# Patient Record
Sex: Female | Born: 1955 | Race: White | Hispanic: No | Marital: Married | Smoking: Never smoker
Health system: Southern US, Community
[De-identification: ages and names within clinical notes are randomized; demographics above are authoritative.]

## PROBLEM LIST (undated history)

## (undated) DIAGNOSIS — I10 Essential (primary) hypertension: Secondary | ICD-10-CM

## (undated) DIAGNOSIS — E785 Hyperlipidemia, unspecified: Secondary | ICD-10-CM

## (undated) DIAGNOSIS — C801 Malignant (primary) neoplasm, unspecified: Secondary | ICD-10-CM

## (undated) HISTORY — DX: Essential (primary) hypertension: I10

## (undated) HISTORY — PX: MOUTH SURGERY: SHX715

## (undated) HISTORY — PX: EYE SURGERY: SHX253

## (undated) HISTORY — PX: CLOSED REDUCTION ANKLE DISLOCATION: SUR209

## (undated) HISTORY — DX: Hyperlipidemia, unspecified: E78.5

---

## 2003-12-19 ENCOUNTER — Encounter: Payer: Self-pay | Admitting: Internal Medicine

## 2004-08-11 ENCOUNTER — Ambulatory Visit: Payer: Self-pay | Admitting: Internal Medicine

## 2005-05-01 ENCOUNTER — Emergency Department (HOSPITAL_COMMUNITY): Admission: EM | Admit: 2005-05-01 | Discharge: 2005-05-01 | Payer: Self-pay | Admitting: Emergency Medicine

## 2005-05-01 ENCOUNTER — Ambulatory Visit: Payer: Self-pay | Admitting: Family Medicine

## 2005-05-10 ENCOUNTER — Ambulatory Visit: Payer: Self-pay | Admitting: Internal Medicine

## 2005-06-21 ENCOUNTER — Ambulatory Visit: Payer: Self-pay | Admitting: Internal Medicine

## 2005-09-20 ENCOUNTER — Ambulatory Visit: Payer: Self-pay | Admitting: Internal Medicine

## 2005-12-20 ENCOUNTER — Ambulatory Visit: Payer: Self-pay | Admitting: Internal Medicine

## 2006-01-20 ENCOUNTER — Ambulatory Visit: Payer: Self-pay | Admitting: Internal Medicine

## 2006-02-14 ENCOUNTER — Ambulatory Visit: Payer: Self-pay | Admitting: Unknown Physician Specialty

## 2006-02-20 ENCOUNTER — Ambulatory Visit: Payer: Self-pay | Admitting: Unknown Physician Specialty

## 2006-04-21 ENCOUNTER — Ambulatory Visit: Payer: Self-pay | Admitting: Internal Medicine

## 2006-05-03 ENCOUNTER — Ambulatory Visit: Payer: Self-pay

## 2006-05-03 ENCOUNTER — Encounter: Payer: Self-pay | Admitting: Cardiology

## 2006-06-02 ENCOUNTER — Ambulatory Visit: Payer: Self-pay | Admitting: Internal Medicine

## 2007-03-14 ENCOUNTER — Encounter: Payer: Self-pay | Admitting: Internal Medicine

## 2007-03-14 DIAGNOSIS — I10 Essential (primary) hypertension: Secondary | ICD-10-CM

## 2007-03-14 DIAGNOSIS — E782 Mixed hyperlipidemia: Secondary | ICD-10-CM | POA: Insufficient documentation

## 2007-03-14 DIAGNOSIS — E1129 Type 2 diabetes mellitus with other diabetic kidney complication: Secondary | ICD-10-CM

## 2007-03-14 DIAGNOSIS — E1165 Type 2 diabetes mellitus with hyperglycemia: Secondary | ICD-10-CM

## 2007-03-23 ENCOUNTER — Encounter: Payer: Self-pay | Admitting: Internal Medicine

## 2007-05-10 ENCOUNTER — Other Ambulatory Visit: Admission: RE | Admit: 2007-05-10 | Discharge: 2007-05-10 | Payer: Self-pay | Admitting: Internal Medicine

## 2007-05-10 ENCOUNTER — Encounter: Payer: Self-pay | Admitting: Internal Medicine

## 2007-05-10 ENCOUNTER — Ambulatory Visit: Payer: Self-pay | Admitting: Internal Medicine

## 2007-05-10 DIAGNOSIS — N209 Urinary calculus, unspecified: Secondary | ICD-10-CM | POA: Insufficient documentation

## 2007-05-11 LAB — CONVERTED CEMR LAB
AST: 16 units/L (ref 0–37)
Albumin: 3.6 g/dL (ref 3.5–5.2)
BUN: 11 mg/dL (ref 6–23)
Chloride: 107 meq/L (ref 96–112)
Creatinine,U: 112.2 mg/dL
GFR calc non Af Amer: 95 mL/min
Hgb A1c MFr Bld: 7.8 % — ABNORMAL HIGH (ref 4.6–6.0)
Potassium: 4 meq/L (ref 3.5–5.1)
TSH: 2.97 microintl units/mL (ref 0.35–5.50)

## 2007-05-15 ENCOUNTER — Encounter (INDEPENDENT_AMBULATORY_CARE_PROVIDER_SITE_OTHER): Payer: Self-pay | Admitting: *Deleted

## 2007-06-06 ENCOUNTER — Ambulatory Visit: Payer: Self-pay | Admitting: Internal Medicine

## 2007-06-06 ENCOUNTER — Encounter: Payer: Self-pay | Admitting: Internal Medicine

## 2007-06-08 ENCOUNTER — Encounter (INDEPENDENT_AMBULATORY_CARE_PROVIDER_SITE_OTHER): Payer: Self-pay | Admitting: *Deleted

## 2007-07-25 ENCOUNTER — Encounter: Payer: Self-pay | Admitting: Internal Medicine

## 2007-08-16 ENCOUNTER — Encounter: Payer: Self-pay | Admitting: Internal Medicine

## 2007-09-12 ENCOUNTER — Encounter: Payer: Self-pay | Admitting: Internal Medicine

## 2007-10-26 ENCOUNTER — Encounter: Payer: Self-pay | Admitting: Internal Medicine

## 2007-11-28 ENCOUNTER — Encounter: Payer: Self-pay | Admitting: Internal Medicine

## 2008-02-01 ENCOUNTER — Encounter: Payer: Self-pay | Admitting: Internal Medicine

## 2008-02-13 ENCOUNTER — Encounter: Payer: Self-pay | Admitting: Internal Medicine

## 2008-02-27 ENCOUNTER — Encounter: Payer: Self-pay | Admitting: Internal Medicine

## 2009-03-13 ENCOUNTER — Ambulatory Visit: Payer: Self-pay | Admitting: Family

## 2009-03-27 ENCOUNTER — Ambulatory Visit: Payer: Self-pay | Admitting: Gastroenterology

## 2009-03-27 ENCOUNTER — Ambulatory Visit: Payer: Self-pay | Admitting: Family

## 2009-04-09 LAB — HM COLONOSCOPY

## 2009-05-23 LAB — HM COLONOSCOPY: HM Colonoscopy: ABNORMAL

## 2009-06-03 LAB — HM COLONOSCOPY

## 2009-09-23 ENCOUNTER — Ambulatory Visit: Payer: Self-pay | Admitting: Family

## 2009-12-04 ENCOUNTER — Inpatient Hospital Stay: Payer: Self-pay | Admitting: Unknown Physician Specialty

## 2010-03-23 LAB — HM MAMMOGRAPHY: HM Mammogram: NORMAL

## 2010-04-01 ENCOUNTER — Ambulatory Visit: Payer: Self-pay | Admitting: Internal Medicine

## 2010-07-29 ENCOUNTER — Ambulatory Visit: Payer: Self-pay | Admitting: Ophthalmology

## 2010-08-03 IMAGING — CR DG ANKLE 2V *L*
1 series · 2 of 2 positions shown · non-contrast
Comparison: none

REASON FOR EXAM: post reduction
COMMENTS:

PROCEDURE:     DXR - DXR ANKLE LEFT AP AND LATERAL  - December 04, 2009  [DATE]
RESULT:     Images of the left ankle labeled postreduction show a more
anatomic alignment with casting material and a splint present.

[Series 1: view not recorded · 0.17mm/px · 2 of 2 slices shown]
[im 1/2]
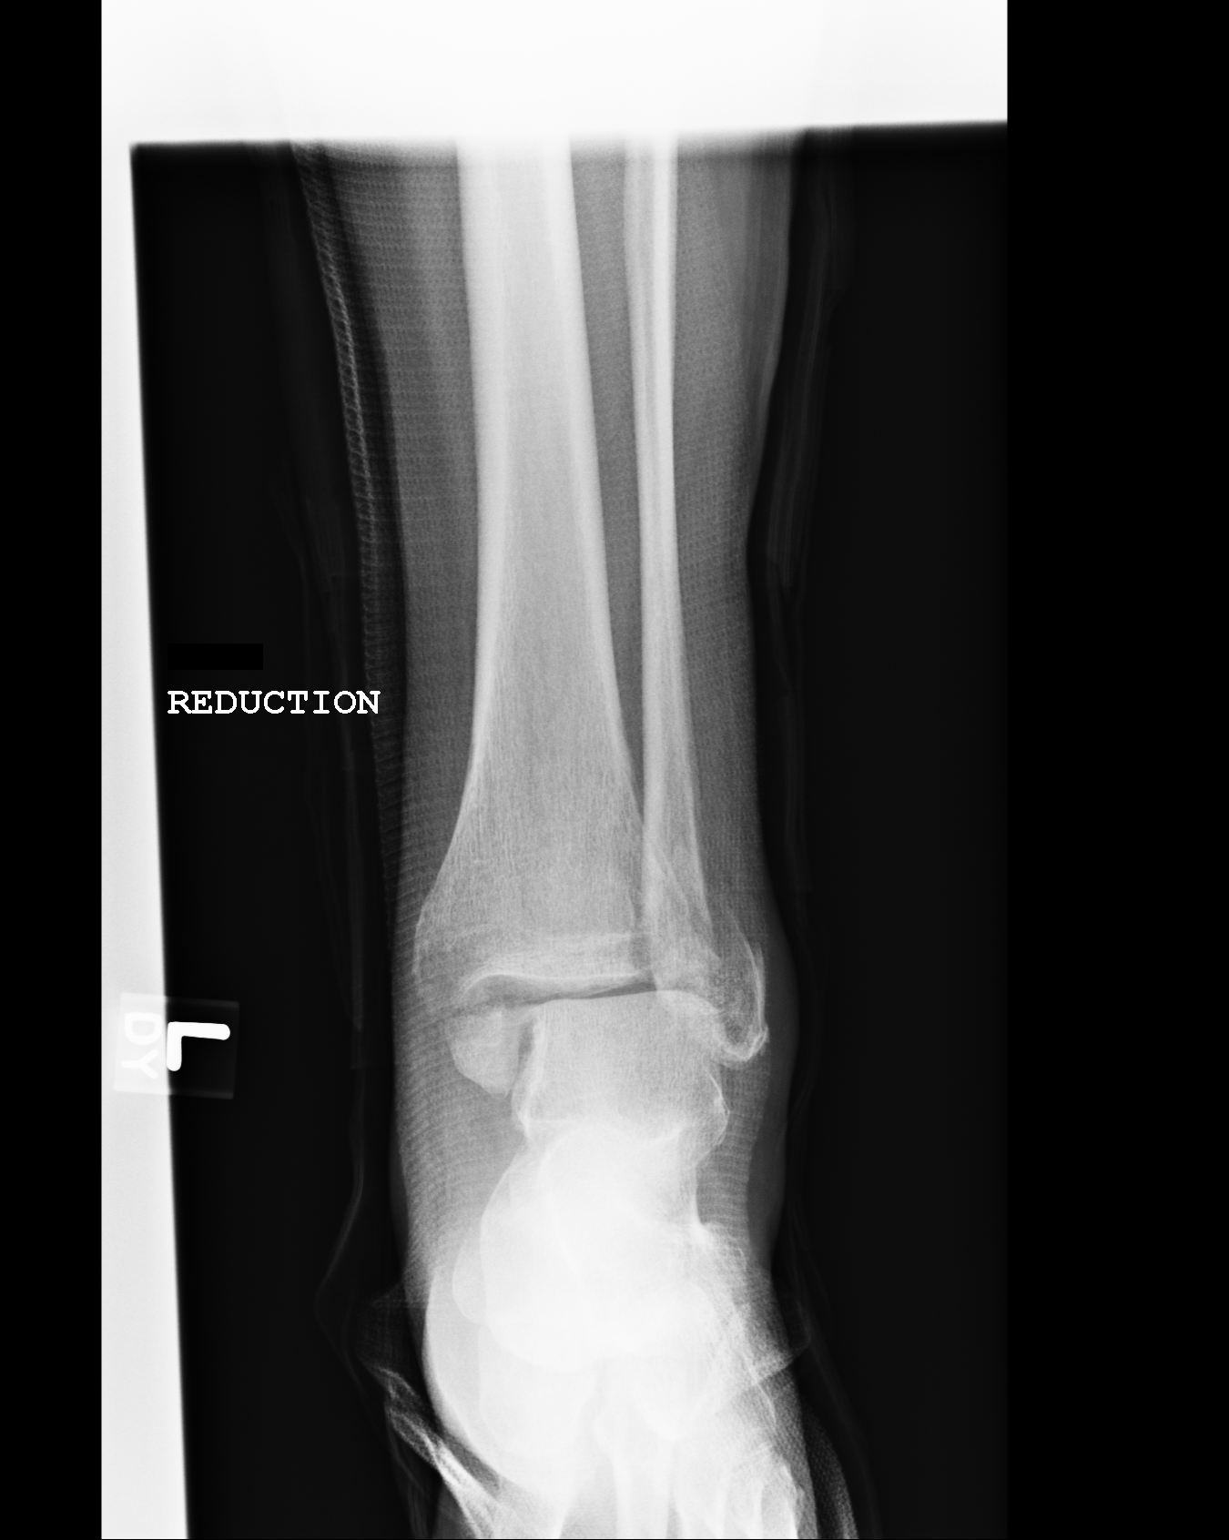
[im 2/2]
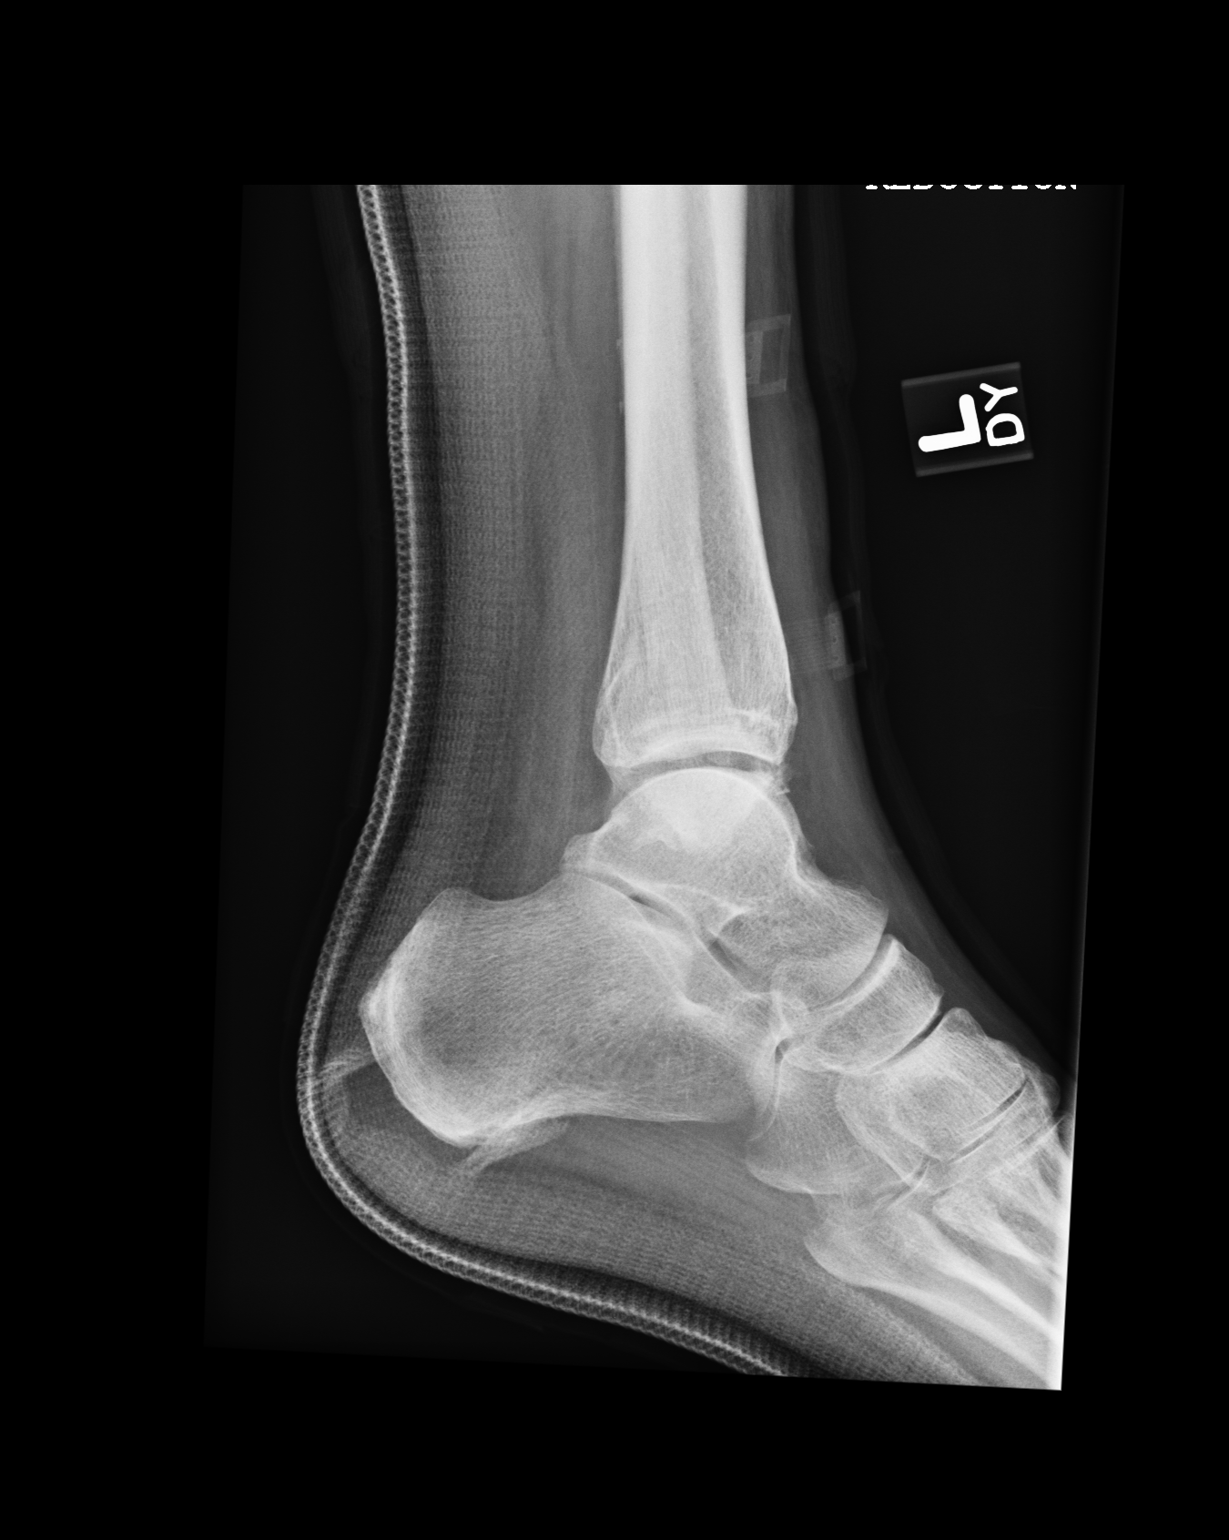

[2 of 2 positions shown; findings below may reference images not displayed]

IMPRESSION: Please see above.

## 2010-08-03 IMAGING — CR DG ANKLE COMPLETE 3+V*L*
1 series · 4 of 4 positions shown · non-contrast
Comparison: none

REASON FOR EXAM: fall; pain
COMMENTS:

[Series 1: view not recorded · 0.17mm/px · 4 of 4 slices shown]
[im 1/4]
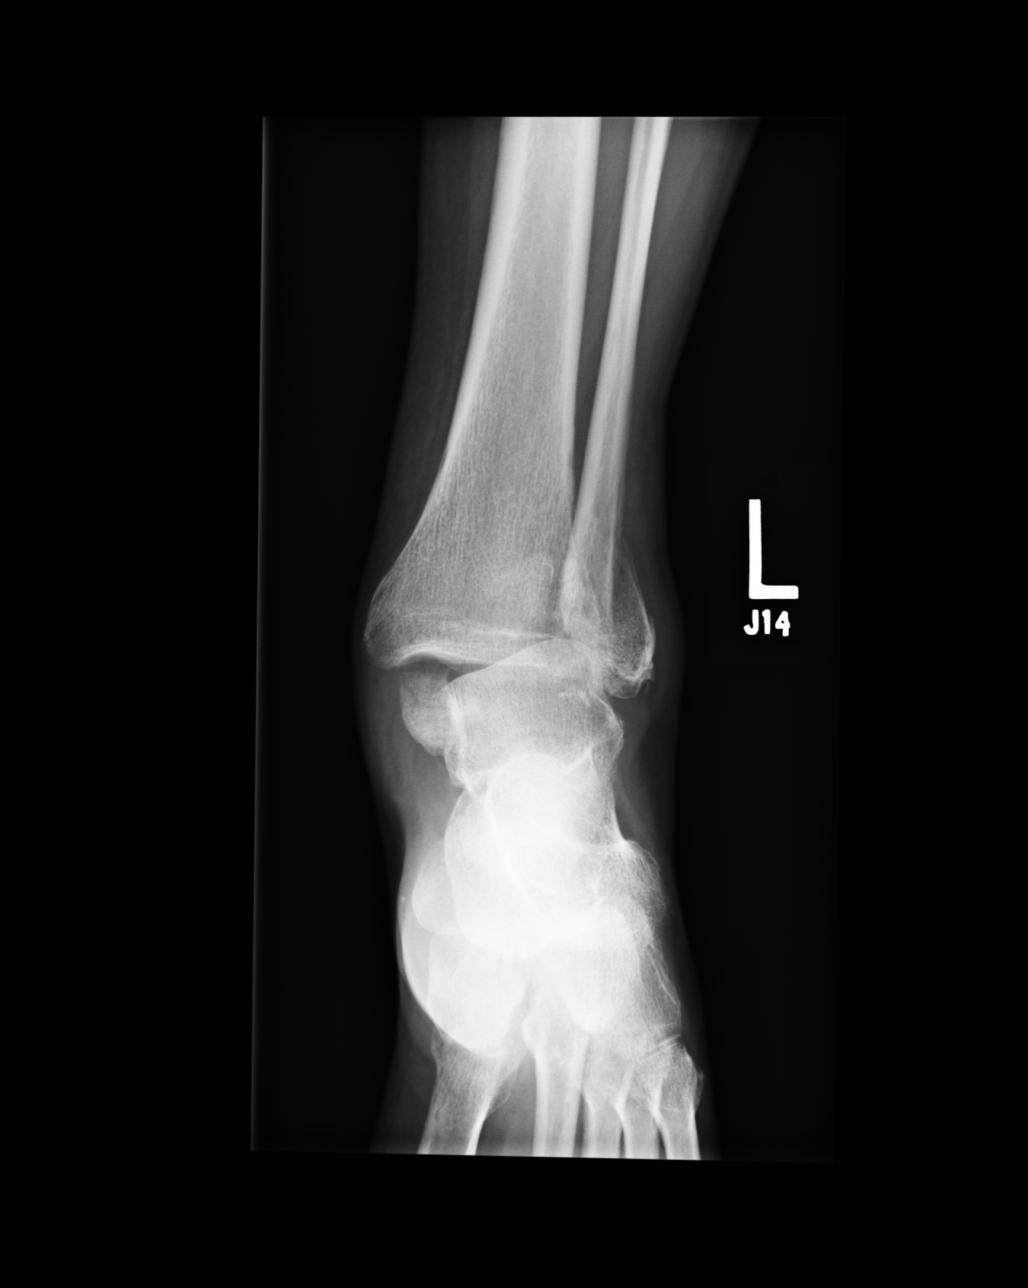
[im 2/4]
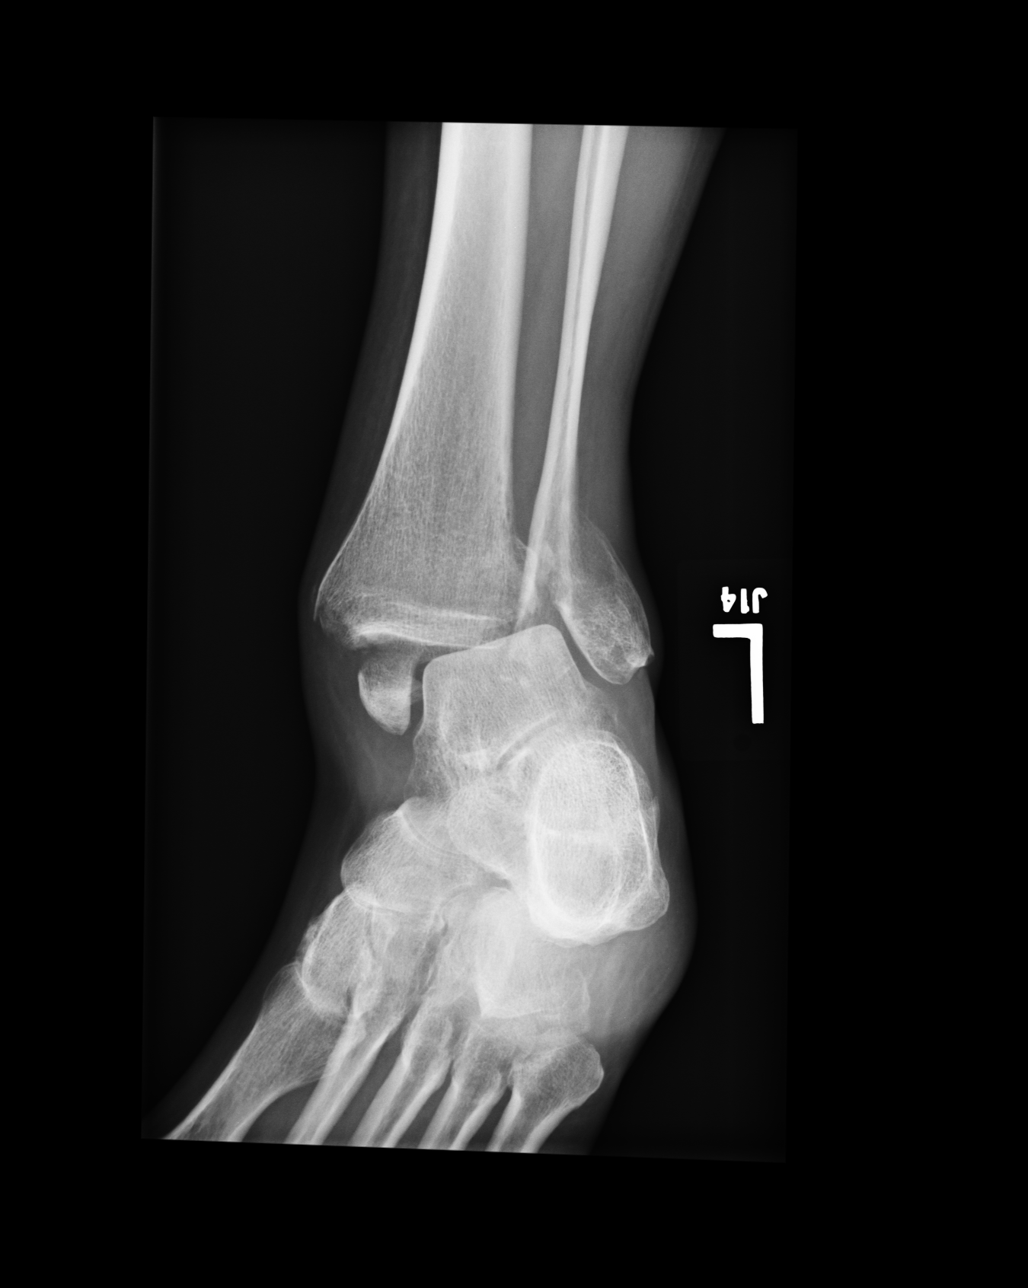
[im 3/4]
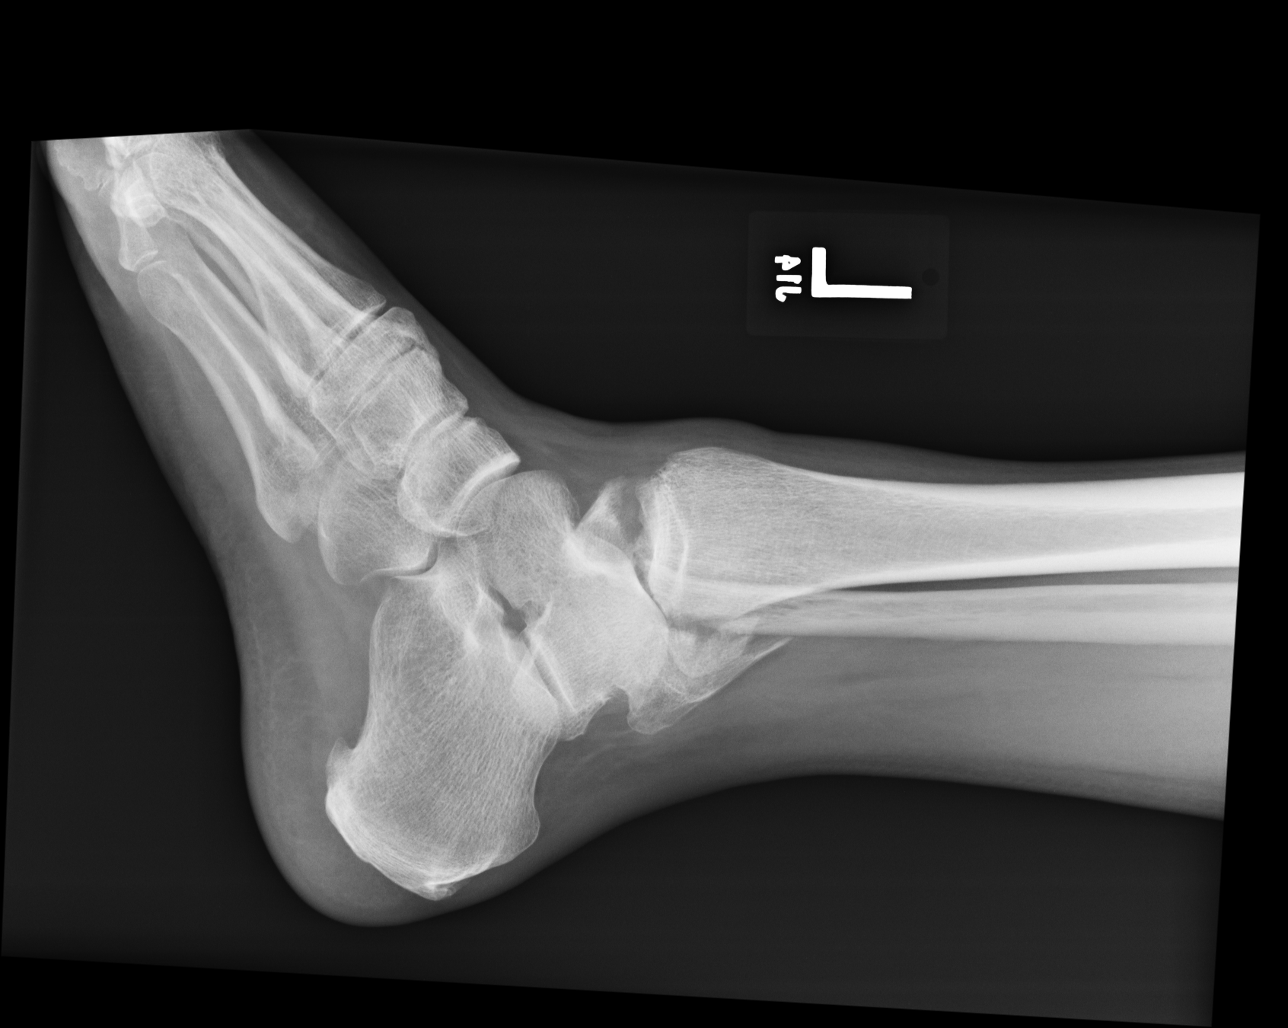
[im 4/4]
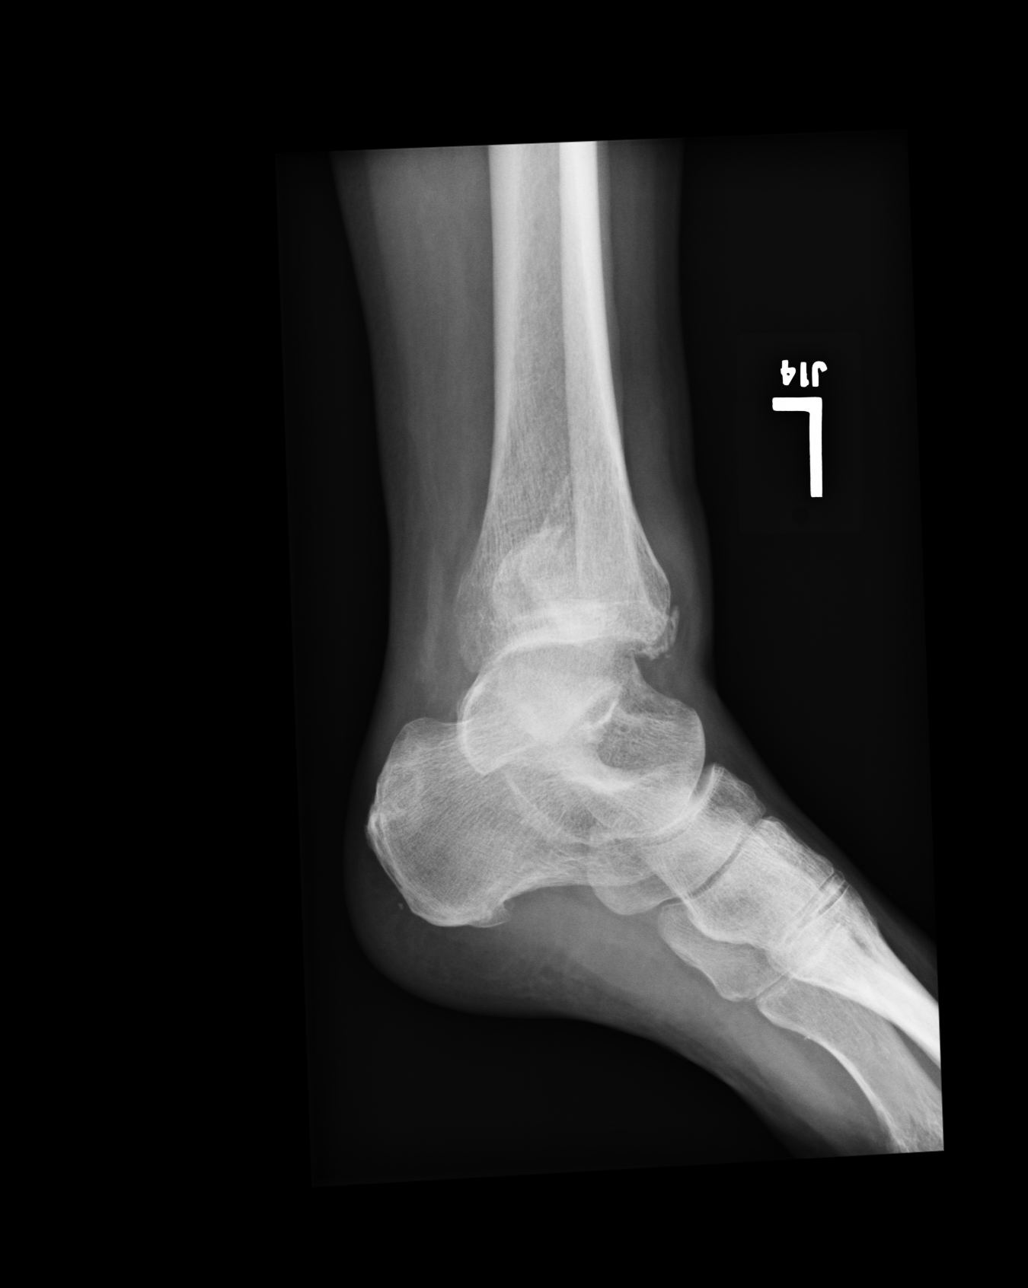

[4 of 4 positions shown; findings below may reference images not displayed]

PROCEDURE:     DXR - DXR ANKLE LEFT COMPLETE  - December 04, 2009 [DATE]

RESULT:     Trimalleolar fracture/dislocation is present with lateral
displacement and angulation and posterior displacement of the talus. There
is deformity of the talar dome suggested on the lateral view. The calcaneus
appears intact with some degenerative spurring.
IMPRESSION: Trimalleolar fracture/dislocation as described.

## 2010-08-03 IMAGING — CR DG CHEST 1V PORT
1 series · 1 of 1 positions shown · non-contrast
Comparison: none

REASON FOR EXAM: ankle fracture preop
COMMENTS:

PROCEDURE:     DXR - DXR PORTABLE CHEST SINGLE VIEW  - December 04, 2009 [DATE]
RESULT:     There is shallow inspiration. The lungs appear clear. The
cardiac silhouette and pulmonary vasculature are normal. The bony and
mediastinal structures are unremarkable.

[view not recorded]
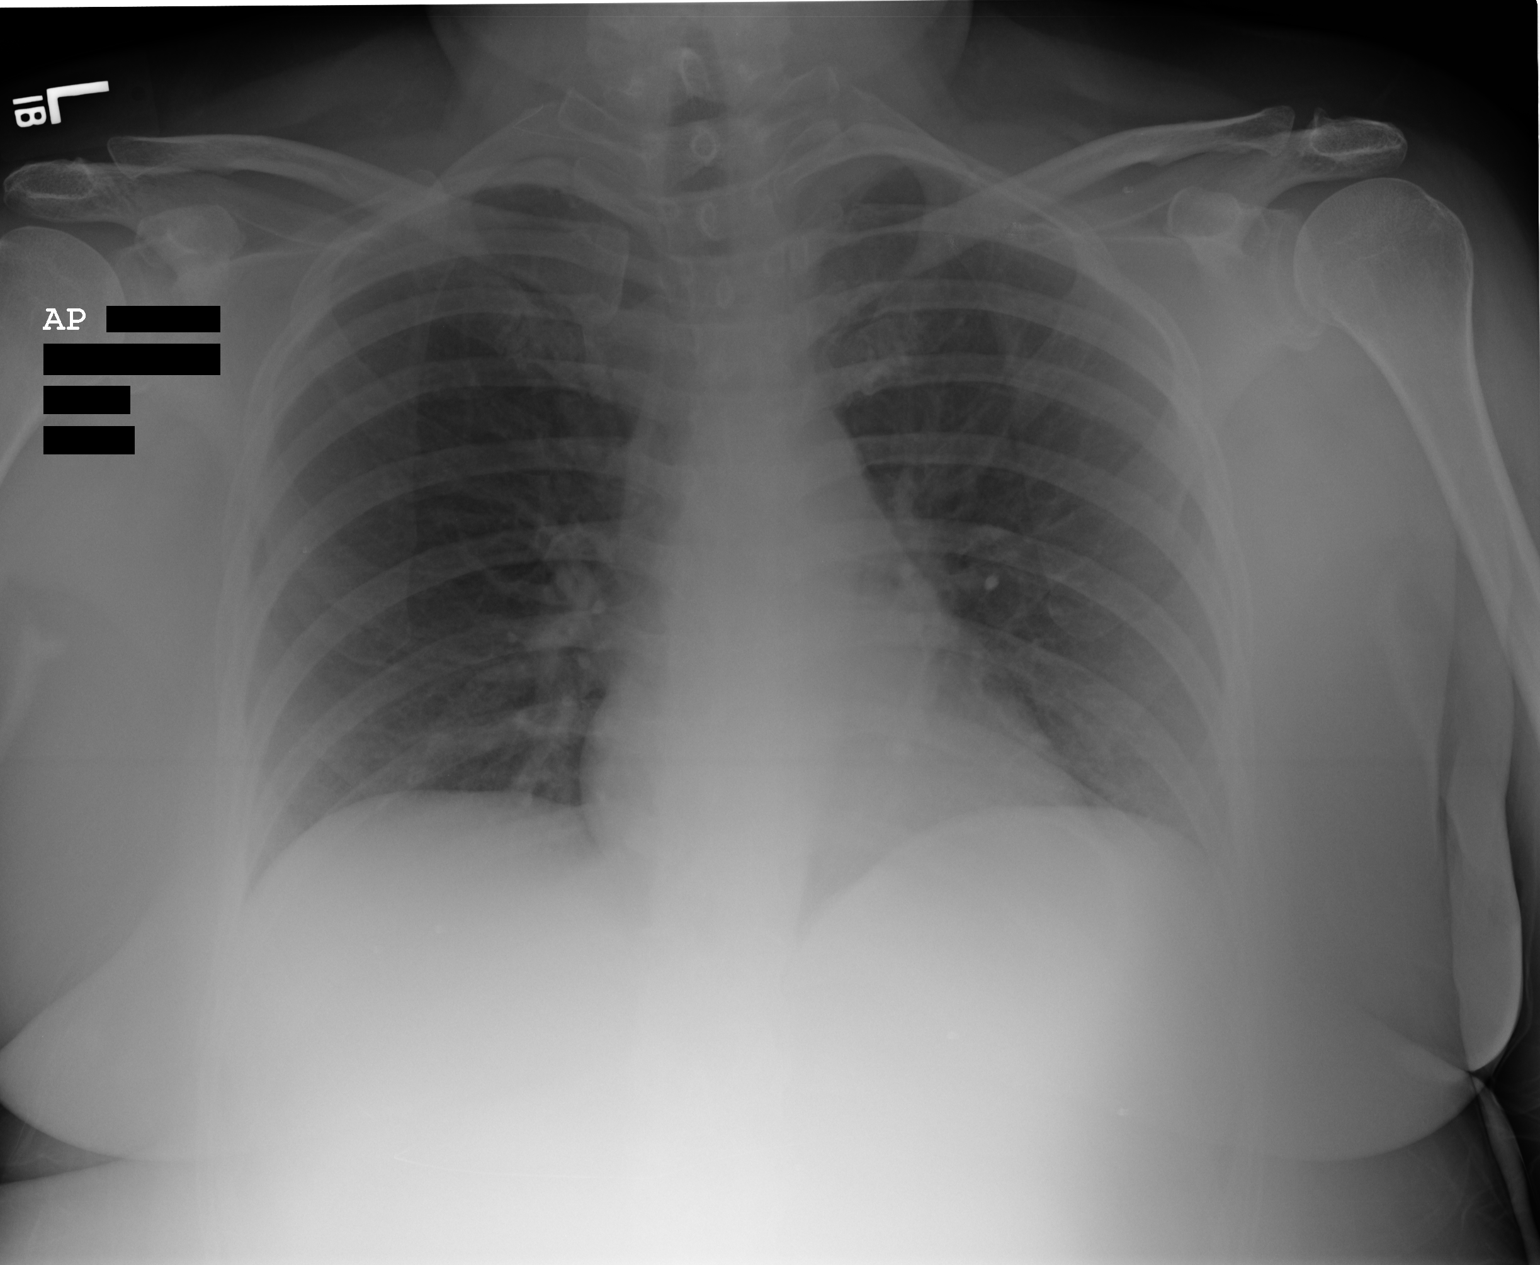

[1 of 1 positions shown; findings below may reference images not displayed]

IMPRESSION: No acute cardiopulmonary disease demonstrated.

## 2010-08-04 ENCOUNTER — Ambulatory Visit: Payer: Self-pay | Admitting: Ophthalmology

## 2010-10-14 ENCOUNTER — Ambulatory Visit: Payer: Self-pay | Admitting: Ophthalmology

## 2011-06-17 ENCOUNTER — Encounter: Payer: Self-pay | Admitting: Internal Medicine

## 2011-06-18 ENCOUNTER — Encounter: Payer: Self-pay | Admitting: Internal Medicine

## 2011-06-18 ENCOUNTER — Ambulatory Visit (INDEPENDENT_AMBULATORY_CARE_PROVIDER_SITE_OTHER): Payer: BC Managed Care – PPO | Admitting: Internal Medicine

## 2011-06-18 DIAGNOSIS — Z1239 Encounter for other screening for malignant neoplasm of breast: Secondary | ICD-10-CM

## 2011-06-18 DIAGNOSIS — Z23 Encounter for immunization: Secondary | ICD-10-CM

## 2011-06-18 DIAGNOSIS — I10 Essential (primary) hypertension: Secondary | ICD-10-CM

## 2011-06-18 DIAGNOSIS — Z79899 Other long term (current) drug therapy: Secondary | ICD-10-CM

## 2011-06-18 DIAGNOSIS — E785 Hyperlipidemia, unspecified: Secondary | ICD-10-CM

## 2011-06-18 DIAGNOSIS — E782 Mixed hyperlipidemia: Secondary | ICD-10-CM

## 2011-06-18 DIAGNOSIS — E039 Hypothyroidism, unspecified: Secondary | ICD-10-CM

## 2011-06-18 DIAGNOSIS — E119 Type 2 diabetes mellitus without complications: Secondary | ICD-10-CM

## 2011-06-18 MED ORDER — LOSARTAN POTASSIUM 100 MG PO TABS: 100.0000 mg | ORAL_TABLET | Freq: Every day | ORAL | Status: DC

## 2011-06-18 MED ORDER — AMLODIPINE BESYLATE 10 MG PO TABS: 10.0000 mg | ORAL_TABLET | Freq: Every day | ORAL | Status: DC

## 2011-06-18 MED ORDER — ATENOLOL 50 MG PO TABS: 50.0000 mg | ORAL_TABLET | Freq: Every day | ORAL | Status: DC

## 2011-06-18 NOTE — Progress Notes (Signed)
  Subjective:    Patient ID: Pamela Harris, female    DOB: 04/30/1956, 55 y.o.   MRN: [id number]  HPI  55 yo perimenopausal female with a history of diabetes mellitus, hyperlipidemia, hypertension and obesity who is here for her annual exam which will be postponed since she is currently menstruating. She has not been exercising regularly but is watching her diet and restricting her carbohydrates.     Review of Systems     Objective:   Physical Exam        Assessment & Plan:

## 2011-06-18 NOTE — Patient Instructions (Signed)
If you check your pre dinner sugar and it is < 150 and your dinner is a nonstarchy dinner (no potatoes, mexican, pasta, or rice based)  Decrease the evening insulin dose to to 90 units   If the meal is starchy and the pre dinner blood sugar is > 150,   Take 110 units. For everything in between continue 100 units.   Check a 4:40 or 5 am blood sugar to  see if you are bottoming out (<80).  I would like you to walk for a minimim of 20 minutes  daily to try to imrorve your weifght and your triglycerides,

## 2011-06-20 ENCOUNTER — Encounter: Payer: Self-pay | Admitting: Internal Medicine

## 2011-06-20 DIAGNOSIS — E039 Hypothyroidism, unspecified: Secondary | ICD-10-CM | POA: Insufficient documentation

## 2011-06-20 NOTE — Assessment & Plan Note (Addendum)
Currently taking red yeast rice, with repeat lipids due,  Goal LDL is 70

## 2011-06-20 NOTE — Assessment & Plan Note (Signed)
Controlled on current therapy ?

## 2011-06-20 NOTE — Assessment & Plan Note (Signed)
Repeat TSH is due on current Synthroid dose.

## 2011-06-21 ENCOUNTER — Telehealth: Payer: Self-pay | Admitting: Internal Medicine

## 2011-06-21 ENCOUNTER — Other Ambulatory Visit (INDEPENDENT_AMBULATORY_CARE_PROVIDER_SITE_OTHER): Payer: BC Managed Care – PPO | Admitting: *Deleted

## 2011-06-21 DIAGNOSIS — Z124 Encounter for screening for malignant neoplasm of cervix: Secondary | ICD-10-CM | POA: Insufficient documentation

## 2011-06-21 DIAGNOSIS — I10 Essential (primary) hypertension: Secondary | ICD-10-CM

## 2011-06-21 DIAGNOSIS — E119 Type 2 diabetes mellitus without complications: Secondary | ICD-10-CM

## 2011-06-21 DIAGNOSIS — E785 Hyperlipidemia, unspecified: Secondary | ICD-10-CM

## 2011-06-21 LAB — COMPREHENSIVE METABOLIC PANEL
AST: 16 U/L (ref 0–37)
CO2: 24 mEq/L (ref 19–32)
Chloride: 107 mEq/L (ref 96–112)
Creatinine, Ser: 0.9 mg/dL (ref 0.4–1.2)
Glucose, Bld: 167 mg/dL — ABNORMAL HIGH (ref 70–99)
Sodium: 139 mEq/L (ref 135–145)

## 2011-06-21 LAB — LIPID PANEL
Cholesterol: 218 mg/dL — ABNORMAL HIGH (ref 0–200)
Total CHOL/HDL Ratio: 5
Triglycerides: 364 mg/dL — ABNORMAL HIGH (ref 0.0–149.0)
VLDL: 72.8 mg/dL — ABNORMAL HIGH (ref 0.0–40.0)

## 2011-06-21 LAB — MICROALBUMIN / CREATININE URINE RATIO: Microalb Creat Ratio: 1.4 mg/g (ref 0.0–30.0)

## 2011-06-21 LAB — HEMOGLOBIN A1C: Hgb A1c MFr Bld: 7.5 % — ABNORMAL HIGH (ref 4.6–6.5)

## 2011-06-21 NOTE — Telephone Encounter (Signed)
You had asked me to find out when patients last PAP was, I called [facility] and they stated her last PAP was 06/17/10.

## 2011-06-22 ENCOUNTER — Telehealth: Payer: Self-pay | Admitting: Internal Medicine

## 2011-06-22 NOTE — Telephone Encounter (Signed)
Diabetes is better controlled.  hgba1c is 7.5   Triglycerides are still > 300  And LDL is 119 so I recommend a trial of tricor  145 mcg one tablet daily  330 3 refills.  reperat fastignl lipids,  CMET and hgba1c in 3 months

## 2011-06-23 NOTE — Telephone Encounter (Signed)
I have tried to call patient with no success due to not having a voicemail set up.

## 2011-06-25 ENCOUNTER — Ambulatory Visit: Payer: Self-pay | Admitting: Internal Medicine

## 2011-06-28 NOTE — Telephone Encounter (Signed)
Tried calling patient, but got no answer and there is no way to leave a message. Will try again later.

## 2011-06-30 MED ORDER — FENOFIBRATE 145 MG PO TABS: 145.0000 mg | ORAL_TABLET | Freq: Every day | ORAL | Status: DC

## 2011-06-30 NOTE — Telephone Encounter (Signed)
Informed pt. Of results and called in Rx.

## 2011-07-13 ENCOUNTER — Encounter: Payer: Self-pay | Admitting: Internal Medicine

## 2011-08-19 ENCOUNTER — Encounter: Payer: Self-pay | Admitting: Internal Medicine

## 2011-09-20 ENCOUNTER — Encounter: Payer: BC Managed Care – PPO | Admitting: Internal Medicine

## 2011-09-24 ENCOUNTER — Other Ambulatory Visit (HOSPITAL_COMMUNITY)
Admission: RE | Admit: 2011-09-24 | Discharge: 2011-09-24 | Disposition: A | Discharge status: Home / Self Care | Payer: BC Managed Care – PPO | Source: Ambulatory Visit | Attending: Internal Medicine | Admitting: Internal Medicine

## 2011-09-24 ENCOUNTER — Ambulatory Visit (INDEPENDENT_AMBULATORY_CARE_PROVIDER_SITE_OTHER): Payer: BC Managed Care – PPO | Admitting: Internal Medicine

## 2011-09-24 ENCOUNTER — Encounter: Payer: Self-pay | Admitting: Internal Medicine

## 2011-09-24 DIAGNOSIS — N760 Acute vaginitis: Secondary | ICD-10-CM | POA: Insufficient documentation

## 2011-09-24 DIAGNOSIS — Z0000 Encounter for general adult medical examination without abnormal findings: Secondary | ICD-10-CM

## 2011-09-24 DIAGNOSIS — B3731 Acute candidiasis of vulva and vagina: Secondary | ICD-10-CM

## 2011-09-24 DIAGNOSIS — E785 Hyperlipidemia, unspecified: Secondary | ICD-10-CM

## 2011-09-24 DIAGNOSIS — I10 Essential (primary) hypertension: Secondary | ICD-10-CM

## 2011-09-24 DIAGNOSIS — Z124 Encounter for screening for malignant neoplasm of cervix: Secondary | ICD-10-CM

## 2011-09-24 DIAGNOSIS — B373 Candidiasis of vulva and vagina: Secondary | ICD-10-CM

## 2011-09-24 DIAGNOSIS — E119 Type 2 diabetes mellitus without complications: Secondary | ICD-10-CM

## 2011-09-24 DIAGNOSIS — Z01419 Encounter for gynecological examination (general) (routine) without abnormal findings: Secondary | ICD-10-CM | POA: Insufficient documentation

## 2011-09-24 MED ORDER — FLUCONAZOLE 150 MG PO TABS: 150.0000 mg | ORAL_TABLET | Freq: Every day | ORAL | Status: AC

## 2011-09-24 MED ORDER — NYSTATIN 100000 UNIT/GM EX CREA: TOPICAL_CREAM | CUTANEOUS | Status: AC | PRN

## 2011-09-24 NOTE — Assessment & Plan Note (Addendum)
Her a1c was 7.5 on 100 units of 7030 insulin twice daily. Her LDL was 113 and trigs 364 in October.    She has normal renal function, mild micro-albuminemia and has been taking a fenifibrate and red yeast rice since last visit.  Repeat labs are due now.

## 2011-09-24 NOTE — Progress Notes (Signed)
Subjective:    Patient ID: Pamela Harris, female    DOB: 09/30/1955, 56 y.o.   MRN: [id number]  HPI    Review of Systems     Objective:   Physical Exam        Assessment & Plan:   Subjective:     Pamela Harris is a 56 y.o. female and is here for a comprehensive physical exam. The patient reports no problems.  History   Social History  . Marital Status: Married    Spouse Name: N/A    Number of Children: N/A  . Years of Education: N/A   Occupational History  . Not on file.   Social History Main Topics  . Smoking status: Never Smoker   . Smokeless tobacco: Never Used  . Alcohol Use: No  . Drug Use: No  . Sexually Active: Not on file   Other Topics Concern  . Not on file   Social History Narrative  . No narrative on file   Health Maintenance  Topic Date Due  . Pap Smear  11/14/1975  . Tetanus/tdap  11/13/1976  . Influenza Vaccine  05/23/2012  . Mammogram  06/24/2013  . Colonoscopy  05/24/2019    The following portions of the patient's history were reviewed and updated as appropriate: allergies, current medications, past family history, past medical history, past social history, past surgical history and problem list.  Review of Systems A comprehensive review of systems was negative.   Objective:    BP 120/86  Pulse 93  Temp(Src) 98.1 F (36.7 C) (Oral)  Resp 12  Ht 5\' 7"  (1.702 m)  Wt 206 lb (93.441 kg)  BMI 32.26 kg/m2  SpO2 99%  General Appearance:    Alert, cooperative, no distress, appears stated age  Head:    Normocephalic, without obvious abnormality, atraumatic  Eyes:    PERRL, conjunctiva/corneas clear, EOM's intact, fundi    benign, both eyes  Ears:    Normal TM's and external ear canals, both ears  Nose:   Nares normal, septum midline, mucosa normal, no drainage    or sinus tenderness  Throat:   Lips, mucosa, and tongue normal; teeth and gums normal  Neck:   Supple, symmetrical, trachea midline, no adenopathy;    thyroid:   no enlargement/tenderness/nodules; no carotid   bruit or JVD  Back:     Symmetric, no curvature, ROM normal, no CVA tenderness  Lungs:     Clear to auscultation bilaterally, respirations unlabored  Chest Wall:    No tenderness or deformity   Heart:    Regular rate and rhythm, S1 and S2 normal, no murmur, rub   or gallop  Breast Exam:    No tenderness, masses, or nipple abnormality  Abdomen:     Soft, non-tender, bowel sounds active all four quadrants,    no masses, no organomegaly  Genitalia:    Normal female without lesion, discharge or tenderness     Extremities:   Extremities normal, atraumatic, no cyanosis or edema  Pulses:   2+ and symmetric all extremities  Skin:   Skin color, texture, turgor normal, no rashes or lesions  Lymph nodes:   Cervical, supraclavicular, and axillary nodes normal  Neurologic:   CNII-XII intact, normal strength, sensation and reflexes    throughout    Assessment:    Healthy female exam.   DIABETES MELLITUS, TYPE II Her a1c was 7.5 on 100 units of 7030 insulin twice daily. Her LDL was 113 and trigs 364  in October.    She has normal renal function, mild micro-albuminemia and has been taking a fenifibrate and red yeast rice since last visit.  Repeat labs are due now.    HYPERLIPIDEMIA Her last lipid panel in October was notable for elevated LDL and triglycerides. She has been taking fenofibrate and red yeast rice. Repeat labs are due  HYPERTENSION Managed with atenolol losartan and amlodipine. Normal renal function her last visit.    Updated Medication List Outpatient Encounter Prescriptions as of 09/24/2011  Medication Sig Dispense Refill  . amLODipine (NORVASC) 10 MG tablet Take 1 tablet (10 mg total) by mouth daily.  90 tablet  3  . atenolol (TENORMIN) 50 MG tablet Take 1 tablet (50 mg total) by mouth daily.  90 tablet  3  . b complex vitamins tablet Take 1 tablet by mouth daily.        . fenofibrate (TRICOR) 145 MG tablet Take 1 tablet (145 mg  total) by mouth daily.  30 tablet  11  . fish oil-omega-3 fatty acids 1000 MG capsule Take 2 g by mouth daily.        . insulin NPH-insulin regular (NOVOLIN 70/30) (70-30) 100 UNIT/ML injection Inject 100 Units into the skin daily with breakfast. 100 units in the evening.       [name] losartan (COZAAR) 100 MG tablet Take 1 tablet (100 mg total) by mouth daily.  90 tablet  3  . Multiple Vitamin (MULTIVITAMIN) tablet Take 1 tablet by mouth daily.        . Red Yeast Rice 600 MG CAPS Take by mouth 2 (two) times daily.        . fluconazole (DIFLUCAN) 150 MG tablet Take 1 tablet (150 mg total) by mouth daily.  2 tablet  0  . nystatin cream (MYCOSTATIN) Apply topically as needed for dry skin.  30 g  1  . DISCONTD: amLODipine-valsartan (EXFORGE) 5-320 MG per tablet Take 1 tablet by mouth daily.            Plan:     See After Visit Summary for Counseling Recommendations

## 2011-09-26 ENCOUNTER — Encounter: Payer: Self-pay | Admitting: Internal Medicine

## 2011-09-26 NOTE — Assessment & Plan Note (Signed)
Her last lipid panel in October was notable for elevated LDL and triglycerides. She has been taking fenofibrate and red yeast rice. Repeat labs are due

## 2011-09-26 NOTE — Assessment & Plan Note (Signed)
Managed with atenolol losartan and amlodipine. Normal renal function her last visit.

## 2011-10-04 ENCOUNTER — Encounter: Payer: Self-pay | Admitting: *Deleted

## 2011-10-04 ENCOUNTER — Telehealth: Payer: Self-pay | Admitting: Internal Medicine

## 2011-10-04 NOTE — Telephone Encounter (Signed)
Correction:  PA P smear normal,  Candida infection was treated at time of visit.

## 2011-10-04 NOTE — Telephone Encounter (Signed)
Letter mailed notifying patient. 

## 2011-10-15 ENCOUNTER — Telehealth: Payer: Self-pay | Admitting: *Deleted

## 2011-10-15 NOTE — Telephone Encounter (Signed)
Advises pt, appt made for next week to discuss.

## 2011-10-15 NOTE — Telephone Encounter (Signed)
Message copied by Eliezer Bottom on Fri Oct 15, 2011 11:57 AM ------      Message from: Duncan Dull      Created: Tue Oct 12, 2011  8:10 AM      Regarding: labs       hgba1xc is fine at 7.1 but triglucerides are high at 486,  neeed to discuss diet and /or meds .  Has she even taken tricor or fenofibrate?

## 2011-10-19 ENCOUNTER — Encounter: Payer: Self-pay | Admitting: Internal Medicine

## 2011-10-19 ENCOUNTER — Ambulatory Visit (INDEPENDENT_AMBULATORY_CARE_PROVIDER_SITE_OTHER): Payer: BC Managed Care – PPO | Admitting: Internal Medicine

## 2011-10-19 DIAGNOSIS — E1129 Type 2 diabetes mellitus with other diabetic kidney complication: Secondary | ICD-10-CM

## 2011-10-19 DIAGNOSIS — E1122 Type 2 diabetes mellitus with diabetic chronic kidney disease: Secondary | ICD-10-CM

## 2011-10-19 DIAGNOSIS — E785 Hyperlipidemia, unspecified: Secondary | ICD-10-CM

## 2011-10-19 DIAGNOSIS — N189 Chronic kidney disease, unspecified: Secondary | ICD-10-CM

## 2011-10-19 NOTE — Patient Instructions (Signed)
Consider Jamse Belfast' low carb pita and flatbread ( [employer] and [employer])  Low carb tortilla by Mission ([employer]ew's [employer])  Toufayan, also males a low carb tortilla ([employer])   I am referring you to a lipid specialist for a second opinion on management of your cholesterol

## 2011-10-19 NOTE — Progress Notes (Signed)
Subjective:    Patient ID: Pamela Harris, female    DOB: 1956-06-01, 56 y.o.   MRN: [id number]  HPI Pamela Harris is a 56 year old white female with a history of diabetes mellitus type 2 with mild chronic renal insufficiency, hyperlipidemia and obesity who presents for followup of abnormal labs. Her fasting lipids were elevated with triglycerides at her on 316 and LDL was 113. He has not been taking a few fibroids her statin because she noticed in the fine print currently takes that it was contraindicated in patients with renal disease she has the opinion that her kidney function improved by repeat labs by her nephrologist when she stopped her Trileptal 54. She also has noticed that she stop the pravastatin her legs have felt less heavy and has attributed her recent fall to use of statins. She does not want to resume therapy for her hyperlipidemia and has been reading several books about the foods that will change her boiy's pH and have been supposedly on will lead to greater lipemic control.  Past Medical History  Diagnosis Date  . Hyperlipidemia   . Hypertension   . Diabetes mellitus    Current Outpatient Prescriptions on File Prior to Visit  Medication Sig Dispense Refill  . amLODipine (NORVASC) 10 MG tablet Take 1 tablet (10 mg total) by mouth daily.  90 tablet  3  . atenolol (TENORMIN) 50 MG tablet Take 1 tablet (50 mg total) by mouth daily.  90 tablet  3  . b complex vitamins tablet Take 1 tablet by mouth daily.        . insulin NPH-insulin regular (NOVOLIN 70/30) (70-30) 100 UNIT/ML injection Inject 100 Units into the skin daily with breakfast. 100 units in the evening.       [name] losartan (COZAAR) 100 MG tablet Take 1 tablet (100 mg total) by mouth daily.  90 tablet  3  . Multiple Vitamin (MULTIVITAMIN) tablet Take 1 tablet by mouth daily.        [name] nystatin cream (MYCOSTATIN) Apply topically as needed for dry skin.  30 g  1     Review of Systems  Constitutional: Negative for fever,  chills and unexpected weight change.  HENT: Negative for hearing loss, ear pain, nosebleeds, congestion, sore throat, facial swelling, rhinorrhea, sneezing, mouth sores, trouble swallowing, neck pain, neck stiffness, voice change, postnasal drip, sinus pressure, tinnitus and ear discharge.   Eyes: Negative for pain, discharge, redness and visual disturbance.  Respiratory: Negative for cough, chest tightness, shortness of breath, wheezing and stridor.   Cardiovascular: Negative for chest pain, palpitations and leg swelling.  Musculoskeletal: Negative for myalgias and arthralgias.  Skin: Negative for color change and rash.  Neurological: Negative for dizziness, weakness, light-headedness and headaches.  Hematological: Negative for adenopathy.         Objective:   Physical Exam  Constitutional: She appears well-developed and well-nourished.  HENT:  Head: Normocephalic.  Psychiatric: She has a normal mood and affect.      Assessment & Plan:   HYPERLIPIDEMIA With hypertriglyceridemia. She is not willing to resume fenofibrate therapy or statin therapy. Have recommended that she begin exercise program and limit her her starch intake to 1 or 2 servings daily. I will also refer her to let lipid specialist for consideration of alternative medications for  Diabetes mellitus with chronic kidney disease The function is stable and glycemic control has improved slightly since last check but is still above 7.0  reviewed the role of diet and exercise  and improving glycemic control and she is reluctant to add additional medications.    Updated Medication List Outpatient Encounter Prescriptions as of 10/19/2011  Medication Sig Dispense Refill  . amLODipine (NORVASC) 10 MG tablet Take 1 tablet (10 mg total) by mouth daily.  90 tablet  3  . atenolol (TENORMIN) 50 MG tablet Take 1 tablet (50 mg total) by mouth daily.  90 tablet  3  . b complex vitamins tablet Take 1 tablet by mouth daily.        .  insulin NPH-insulin regular (NOVOLIN 70/30) (70-30) 100 UNIT/ML injection Inject 100 Units into the skin daily with breakfast. 100 units in the evening.       [name] losartan (COZAAR) 100 MG tablet Take 1 tablet (100 mg total) by mouth daily.  90 tablet  3  . Multiple Vitamin (MULTIVITAMIN) tablet Take 1 tablet by mouth daily.        [name] nystatin cream (MYCOSTATIN) Apply topically as needed for dry skin.  30 g  1  . DISCONTD: fenofibrate (TRICOR) 145 MG tablet Take 1 tablet (145 mg total) by mouth daily.  30 tablet  11  . DISCONTD: fish oil-omega-3 fatty acids 1000 MG capsule Take 2 g by mouth daily.        [name] DISCONTD: Red Yeast Rice 600 MG CAPS Take by mouth 2 (two) times daily.

## 2011-10-19 NOTE — Assessment & Plan Note (Signed)
With hypertriglyceridemia. She is not willing to resume fenofibrate therapy or statin therapy. Have recommended that she begin exercise program and limit her her starch intake to 1 or 2 servings daily. I will also refer her to let lipid specialist for consideration of alternative medications for

## 2011-10-19 NOTE — Assessment & Plan Note (Signed)
The function is stable and glycemic control has improved slightly since last check but is still above 7.0  reviewed the role of diet and exercise and improving glycemic control and she is reluctant to add additional medications.

## 2011-10-20 ENCOUNTER — Encounter: Payer: Self-pay | Admitting: Internal Medicine

## 2011-10-26 ENCOUNTER — Encounter: Payer: Self-pay | Admitting: Internal Medicine

## 2011-12-22 ENCOUNTER — Ambulatory Visit (INDEPENDENT_AMBULATORY_CARE_PROVIDER_SITE_OTHER): Payer: BC Managed Care – PPO | Admitting: Internal Medicine

## 2011-12-22 ENCOUNTER — Encounter: Payer: Self-pay | Admitting: Internal Medicine

## 2011-12-22 VITALS — BP 116/70 | HR 74 | Temp 98.2°F | Resp 16 | Ht 66.5 in | Wt 203.5 lb

## 2011-12-22 DIAGNOSIS — I10 Essential (primary) hypertension: Secondary | ICD-10-CM

## 2011-12-22 DIAGNOSIS — N189 Chronic kidney disease, unspecified: Secondary | ICD-10-CM

## 2011-12-22 DIAGNOSIS — E785 Hyperlipidemia, unspecified: Secondary | ICD-10-CM

## 2011-12-22 DIAGNOSIS — J301 Allergic rhinitis due to pollen: Secondary | ICD-10-CM

## 2011-12-22 DIAGNOSIS — E1129 Type 2 diabetes mellitus with other diabetic kidney complication: Secondary | ICD-10-CM

## 2011-12-22 DIAGNOSIS — N058 Unspecified nephritic syndrome with other morphologic changes: Secondary | ICD-10-CM

## 2011-12-22 DIAGNOSIS — E1122 Type 2 diabetes mellitus with diabetic chronic kidney disease: Secondary | ICD-10-CM

## 2011-12-22 MED ORDER — CEPHALEXIN 500 MG PO TABS: 500.0000 mg | ORAL_TABLET | Freq: Three times a day (TID) | ORAL | Status: AC

## 2011-12-22 NOTE — Assessment & Plan Note (Addendum)
Managed with considerably high doses of  70/30 insulin twice daily, usually 60 in the morning and 80 in the evening. Last hemoglobin A1c was 7.1. No changes were made today other than to recommend a low glycemic index diet and daily exercise. Is due for repeat A1c in 2 weeks. Her creatinine has been stable and she has a minimal amount of microscopic proteinuria continue the Cozaar annual dilated retinal exams and aspirin.

## 2011-12-22 NOTE — Patient Instructions (Addendum)
Try Allegra (fexofenadine  180 mg )  Or Zyrtec (cetirizine  10 mg )  Daily  For allergies if saline is not enough  We will repeat your lipids and start niaspan and fenofibrate if your triglycerides are over 300  Consider the Low Glycemic Index Diet and 6 smaller meals daily .  This boosts your metabolism and regulates your sugars:   7 AM Low carbohydrate Protein  Shakes (EAS Carb Control  Or Atkins ,  Available everywhere,   In  cases at [employer]Js )  2.5 carbs  (Add or substitute  Arnold's toasted Sandwhich thin w/ peanut butter)   10 AM: Protein bar by Atkins (snack size,  Chocolate lover's variety at  [employer]J's)    Lunch: sandwich on pita bread or flatbread (Joseph's makes a pita bread and a flat bread , available at [employer] and [employer]; Toufayah makes a low carb flatbread available at [employer] and [employer]) Mission makes a low carb whole wheat tortilla available at [employer] most grocery stores   3 PM:  Mid day :  Another protein bar,  Or a  cheese stick, 1/4 cup of almonds, walnuts, pistachios, pecans, peanuts,  Macadamia nuts  6 PM  Dinner:  "mean and green:"  Meat/chicken/fish, salad, and green veggie : use ranch, vinagrette,  Blue cheese, etc  9 PM snack : Breyer's low carb fudgsicle or  ice cream bar (Carb Smart), or  Weight Watcher's ice cream bar , or another protein shake Choban's low fat greek yogurt  Or consider fresh berries and whipped cream without the cake

## 2011-12-22 NOTE — Assessment & Plan Note (Signed)
Well controlled on current medications.  No changes today. 

## 2011-12-22 NOTE — Progress Notes (Signed)
Patient ID: Pamela Harris, female   DOB: 05/14/1956, 56 y.o.   MRN: [id number]  Patient Active Problem List  Diagnoses  . Diabetes mellitus with chronic kidney disease  . HYPERLIPIDEMIA  . HYPERTENSION  . CALCULUS, URINARY NOS  . Hypothyroidism  . Screening for cervical cancer    Subjective:  CC:   Chief Complaint  Patient presents with  . Follow-up    3 month  . Diabetes    HPI:    Pamela Harris a 56 y.o. female who presents for Follow up on diabetes mellitus,  Hyperlipidemia, and obesity.  She has been using 60 to 80 units of 70/30 insulin twice daily, depending on her food intake.  Has had more than a few days of excessive eating. Her fasting sugars have averaged 100-140 but she is an occasional 180. She has not checked her sugars at 3 AM to see if she is rebounding. She denies any numbness or tingling of her lower extremities but has developed several papular erythematous nodules on her feet notably on the Achilles tendon/calcaneus and a knuckle of the second toe.  She denies any hypoglycemic events while awake. Second issue is sinus congestion and allergic rhinitis due to current season. She is avoiding going outside. She is using Claritin with no particular success.  Past Medical History  Diagnosis Date  . Hyperlipidemia   . Hypertension   . Diabetes mellitus     History reviewed. No pertinent past surgical history.       The following portions of the patient's history were reviewed and updated as appropriate: Allergies, current medications, and problem list.    Review of Systems:   12 Pt  review of systems was negative except those addressed in the HPI,     History   Social History  . Marital Status: Married    Spouse Name: N/A    Number of Children: N/A  . Years of Education: N/A   Occupational History  . Not on file.   Social History Main Topics  . Smoking status: Never Smoker   . Smokeless tobacco: Never Used  . Alcohol Use: No  .  Drug Use: No  . Sexually Active: Not on file   Other Topics Concern  . Not on file   Social History Narrative  . No narrative on file    Objective:  BP 116/70  Pulse 74  Temp(Src) 98.2 F (36.8 C) (Oral)  Resp 16  Ht 5' 6.5" (1.689 m)  Wt 203 lb 8 oz (92.307 kg)  BMI 32.35 kg/m2  SpO2 100%  LMP 09/30/2011  General appearance: alert, cooperative and appears stated age Ears: normal TM's and external ear canals both ears Throat: lips, mucosa, and tongue normal; teeth and gums normal Neck: no adenopathy, no carotid bruit, supple, symmetrical, trachea midline and thyroid not enlarged, symmetric, no tenderness/mass/nodules Back: symmetric, no curvature. ROM normal. No CVA tenderness. Lungs: clear to auscultation bilaterally Heart: regular rate and rhythm, S1, S2 normal, no murmur, click, rub or gallop Abdomen: soft, non-tender; bowel sounds normal; no masses,  no organomegaly Pulses: 2+ and symmetric Skin: Skin color, texture, turgor normal. No rashes or lesions Lymph nodes: Cervical, supraclavicular, and axillary nodes normal.  Assessment and Plan:  Diabetes mellitus with chronic kidney disease Managed with considerably high doses of  70/30 insulin twice daily, usually 60 in the morning and 80 in the evening. Last hemoglobin A1c was 7.1. No changes were made today other than to recommend a low glycemic  index diet and daily exercise. Is due for repeat A1c in 2 weeks. Her creatinine has been stable and she has a minimal amount of microscopic proteinuria continue the Cozaar annual dilated retinal exams and aspirin.   HYPERLIPIDEMIA Her She discontinued her statin at last visit due to concerns it was affecting her memory. Without statin therapy her triglycerides are over 400. We will repeat her lipids today and she has agreed to resuming a trial fenofibratewhich she has tolerated in the past. I also recommend a low glycemic index diet to help manage her  hyperlipidemia  HYPERTENSION Well controlled on current medications.  No changes today.    Updated Medication List Outpatient Encounter Prescriptions as of 12/22/2011  Medication Sig Dispense Refill  . amLODipine (NORVASC) 10 MG tablet Take 1 tablet (10 mg total) by mouth daily.  90 tablet  3  . atenolol (TENORMIN) 50 MG tablet Take 1 tablet (50 mg total) by mouth daily.  90 tablet  3  . insulin NPH-insulin regular (NOVOLIN 70/30) (70-30) 100 UNIT/ML injection Inject 60 Units into the skin daily with breakfast. 80 units in the evening.      [name] losartan (COZAAR) 100 MG tablet Take 1 tablet (100 mg total) by mouth daily.  90 tablet  3  . Multiple Vitamin (MULTIVITAMIN) tablet Take 1 tablet by mouth daily.        [name] nystatin cream (MYCOSTATIN) Apply topically as needed for dry skin.  30 g  1  . Cephalexin 500 MG tablet Take 1 tablet (500 mg total) by mouth 3 (three) times daily.  21 tablet  0  . DISCONTD: b complex vitamins tablet Take 1 tablet by mouth daily.           Orders Placed This Encounter  Procedures  . Hepatic function panel  . Basic metabolic panel  . Lipid panel  . Hemoglobin A1c    No Follow-up on file.

## 2011-12-22 NOTE — Assessment & Plan Note (Addendum)
Her She discontinued her statin at last visit due to concerns it was affecting her memory. Without statin therapy her triglycerides are over 400. We will repeat her lipids today and she has agreed to resuming a trial fenofibratewhich she has tolerated in the past. I also recommend a low glycemic index diet to help manage her hyperlipidemia

## 2011-12-22 NOTE — Assessment & Plan Note (Signed)
Recommend a trial of Allegra or Zyrtec once daily to manage her symptoms  and prevent congestion and a sinus infection.

## 2012-01-03 ENCOUNTER — Other Ambulatory Visit: Payer: Self-pay | Admitting: Internal Medicine

## 2012-01-03 MED ORDER — INSULIN NPH ISOPHANE & REGULAR (70-30) 100 UNIT/ML ~~LOC~~ SUSP: 60.0000 [IU] | Freq: Every day | SUBCUTANEOUS | Status: DC

## 2012-01-10 ENCOUNTER — Other Ambulatory Visit (INDEPENDENT_AMBULATORY_CARE_PROVIDER_SITE_OTHER): Payer: BC Managed Care – PPO | Admitting: *Deleted

## 2012-01-10 DIAGNOSIS — E1129 Type 2 diabetes mellitus with other diabetic kidney complication: Secondary | ICD-10-CM

## 2012-01-10 DIAGNOSIS — E1122 Type 2 diabetes mellitus with diabetic chronic kidney disease: Secondary | ICD-10-CM

## 2012-01-10 DIAGNOSIS — N189 Chronic kidney disease, unspecified: Secondary | ICD-10-CM

## 2012-01-10 DIAGNOSIS — N058 Unspecified nephritic syndrome with other morphologic changes: Secondary | ICD-10-CM

## 2012-01-10 LAB — BASIC METABOLIC PANEL
BUN: 15 mg/dL (ref 6–23)
CO2: 23 mEq/L (ref 19–32)
Calcium: 9 mg/dL (ref 8.4–10.5)
Chloride: 106 mEq/L (ref 96–112)
Creatinine, Ser: 0.9 mg/dL (ref 0.4–1.2)

## 2012-01-10 LAB — HEPATIC FUNCTION PANEL
ALT: 14 U/L (ref 0–35)
AST: 17 U/L (ref 0–37)
Albumin: 3.4 g/dL — ABNORMAL LOW (ref 3.5–5.2)

## 2012-01-10 LAB — HEMOGLOBIN A1C: Hgb A1c MFr Bld: 6.8 % — ABNORMAL HIGH (ref 4.6–6.5)

## 2012-01-10 LAB — LIPID PANEL
Cholesterol: 199 mg/dL (ref 0–200)
Triglycerides: 254 mg/dL — ABNORMAL HIGH (ref 0.0–149.0)

## 2012-01-10 NOTE — Progress Notes (Signed)
Addended by: Melody Comas L on: 01/10/2012 11:21 AM   Modules accepted: Orders

## 2012-03-21 ENCOUNTER — Other Ambulatory Visit: Payer: Self-pay | Admitting: *Deleted

## 2012-03-21 ENCOUNTER — Other Ambulatory Visit: Payer: Self-pay | Admitting: Internal Medicine

## 2012-03-21 MED ORDER — INSULIN NPH ISOPHANE & REGULAR (70-30) 100 UNIT/ML ~~LOC~~ SUSP: 60.0000 [IU] | Freq: Every day | SUBCUTANEOUS | Status: DC

## 2012-03-21 MED ORDER — INSULIN SYRINGES (DISPOSABLE) U-100 1 ML MISC: Status: DC

## 2012-03-24 ENCOUNTER — Ambulatory Visit (INDEPENDENT_AMBULATORY_CARE_PROVIDER_SITE_OTHER): Payer: BC Managed Care – PPO | Admitting: Internal Medicine

## 2012-03-24 ENCOUNTER — Encounter: Payer: Self-pay | Admitting: Internal Medicine

## 2012-03-24 VITALS — BP 122/72 | HR 66 | Temp 98.0°F | Resp 16 | Wt 201.5 lb

## 2012-03-24 DIAGNOSIS — E785 Hyperlipidemia, unspecified: Secondary | ICD-10-CM

## 2012-03-24 DIAGNOSIS — N058 Unspecified nephritic syndrome with other morphologic changes: Secondary | ICD-10-CM

## 2012-03-24 DIAGNOSIS — I10 Essential (primary) hypertension: Secondary | ICD-10-CM

## 2012-03-24 DIAGNOSIS — E1122 Type 2 diabetes mellitus with diabetic chronic kidney disease: Secondary | ICD-10-CM

## 2012-03-24 DIAGNOSIS — N189 Chronic kidney disease, unspecified: Secondary | ICD-10-CM

## 2012-03-24 DIAGNOSIS — E1129 Type 2 diabetes mellitus with other diabetic kidney complication: Secondary | ICD-10-CM

## 2012-03-24 DIAGNOSIS — Z1239 Encounter for other screening for malignant neoplasm of breast: Secondary | ICD-10-CM

## 2012-03-24 DIAGNOSIS — E669 Obesity, unspecified: Secondary | ICD-10-CM

## 2012-03-24 DIAGNOSIS — E119 Type 2 diabetes mellitus without complications: Secondary | ICD-10-CM

## 2012-03-24 NOTE — Patient Instructions (Addendum)
Return at the end of August for your fasting labs.    We will set up your mammogram

## 2012-03-24 NOTE — Assessment & Plan Note (Addendum)
Well controlled on current medications.  No changes today.h gba1c due 1 monhts  Sugars in range per patient. On current insulin regimen,

## 2012-03-26 ENCOUNTER — Encounter: Payer: Self-pay | Admitting: Internal Medicine

## 2012-03-26 DIAGNOSIS — E669 Obesity, unspecified: Secondary | ICD-10-CM | POA: Insufficient documentation

## 2012-03-26 NOTE — Assessment & Plan Note (Signed)
Untreated due to patient preference to avoid statins. Her last untreated LDL was 101 and her triglycerides are 254 recommended diet and exercise specifically low glycemic index diet to help her to gastritis. Repeat due in one month.

## 2012-03-26 NOTE — Assessment & Plan Note (Signed)
Well controlled on current regimen. Renal function stable, no changes today. 

## 2012-03-26 NOTE — Assessment & Plan Note (Signed)
With a 30 pound weight gain noted 2080 2012. She has been addressing her weight with diet and exercise and is steadily losing weight.

## 2012-03-26 NOTE — Progress Notes (Signed)
Patient ID: Pamela Harris, female   DOB: 11-30-1955, 56 y.o.   MRN: [id number] Patient Active Problem List  Diagnosis  . Diabetes mellitus with chronic kidney disease  . HYPERLIPIDEMIA  . HYPERTENSION  . Hypothyroidism  . Obesity (BMI 30-39.9)    Subjective:  CC:   Chief Complaint  Patient presents with  . Follow-up    3 month    HPI:   Pamela Harris a 56 y.o. female who presents  For follow up on chronic conditions including diabetes mellitus, hyperlipidemia, and obesity. She has no acute complaints today. Her blood sugars have been better controlled over the last several months since she addressed her dietary regimen with a low glycemic index diet. She continues to use insulin twice daily. She's not had any recent lows. She stopped her statin several months ago do to leg pain and concern about her memory. She is not interested in resuming it. She is exercising 2-3 times a week with a walking program.   Past Medical History  Diagnosis Date  . Hyperlipidemia   . Hypertension   . Diabetes mellitus     History reviewed. No pertinent past surgical history.   The following portions of the patient's history were reviewed and updated as appropriate: Allergies, current medications, and problem list.    Review of Systems:   12 Pt  review of systems was negative except those addressed in the HPI,     History   Social History  . Marital Status: Married    Spouse Name: N/A    Number of Children: N/A  . Years of Education: N/A   Occupational History  . Not on file.   Social History Main Topics  . Smoking status: Never Smoker   . Smokeless tobacco: Never Used  . Alcohol Use: No  . Drug Use: No  . Sexually Active: Not on file   Other Topics Concern  . Not on file   Social History Narrative  . No narrative on file    Objective:  BP 122/72  Pulse 66  Temp 98 F (36.7 C) (Oral)  Resp 16  Wt 201 lb 8 oz (91.4 kg)  SpO2 96%  LMP  01/29/2012  General appearance: alert, cooperative and appears stated age Ears: normal TM's and external ear canals both ears Throat: lips, mucosa, and tongue normal; teeth and gums normal Neck: no adenopathy, no carotid bruit, supple, symmetrical, trachea midline and thyroid not enlarged, symmetric, no tenderness/mass/nodules Back: symmetric, no curvature. ROM normal. No CVA tenderness. Lungs: clear to auscultation bilaterally Heart: regular rate and rhythm, S1, S2 normal, no murmur, click, rub or gallop Abdomen: soft, non-tender; bowel sounds normal; no masses,  no organomegaly Pulses: 2+ and symmetric Skin: Skin color, texture, turgor normal. No rashes or lesions Lymph nodes: Cervical, supraclavicular, and axillary nodes normal. Foot exam:  Nails are well trimmed,  No callouses,  Sensation intact to microfilament  Assessment and Plan:  Diabetes mellitus with chronic kidney disease Well controlled on current medications.  No changes today.h gba1c due 1 monhts  Sugars in range per patient. On current insulin regimen,   HYPERTENSION Well controlled on current regimen. Renal function stable, no changes today.  HYPERLIPIDEMIA Untreated due to patient preference to avoid statins. Her last untreated LDL was 101 and her triglycerides are 254 recommended diet and exercise specifically low glycemic index diet to help her to gastritis. Repeat due in one month.  Obesity (BMI 30-39.9) With a 30 pound weight gain noted 2080  2012. She has been addressing her weight with diet and exercise and is steadily losing weight.   Updated Medication List Outpatient Encounter Prescriptions as of 03/24/2012  Medication Sig Dispense Refill  . amLODipine (NORVASC) 10 MG tablet Take 1 tablet (10 mg total) by mouth daily.  90 tablet  3  . atenolol (TENORMIN) 50 MG tablet Take 1 tablet (50 mg total) by mouth daily.  90 tablet  3  . insulin NPH-insulin regular (NOVOLIN 70/30) (70-30) 100 UNIT/ML injection Inject  60 Units into the skin daily with breakfast. 80 units in the evening.  10 mL  5  . Insulin Syringes, Disposable, U-100 1 ML MISC Use as directed  100 each  6  . losartan (COZAAR) 100 MG tablet Take 1 tablet (100 mg total) by mouth daily.  90 tablet  3  . Multiple Vitamin (MULTIVITAMIN) tablet Take 1 tablet by mouth daily.        [name] nystatin cream (MYCOSTATIN) Apply topically as needed for dry skin.  30 g  1  . Vitamin D, Ergocalciferol, (DRISDOL) 50000 UNITS CAPS Take 50,000 Units by mouth every 7 (seven) days.         Orders Placed This Encounter  Procedures  . MM Digital Screening  . Hemoglobin A1c  . Comprehensive metabolic panel  . Lipid panel  . HM PAP SMEAR    No Follow-up on file.

## 2012-04-21 ENCOUNTER — Other Ambulatory Visit (INDEPENDENT_AMBULATORY_CARE_PROVIDER_SITE_OTHER): Payer: BC Managed Care – PPO | Admitting: *Deleted

## 2012-04-21 DIAGNOSIS — E119 Type 2 diabetes mellitus without complications: Secondary | ICD-10-CM

## 2012-04-21 LAB — HEMOGLOBIN A1C: Hgb A1c MFr Bld: 6.6 % — ABNORMAL HIGH (ref 4.6–6.5)

## 2012-04-21 LAB — COMPREHENSIVE METABOLIC PANEL
ALT: 11 U/L (ref 0–35)
AST: 12 U/L (ref 0–37)
Calcium: 8.9 mg/dL (ref 8.4–10.5)
Chloride: 106 mEq/L (ref 96–112)
Creatinine, Ser: 1 mg/dL (ref 0.4–1.2)
Sodium: 136 mEq/L (ref 135–145)

## 2012-04-21 LAB — LIPID PANEL
HDL: 44.5 mg/dL (ref >39.00)
VLDL: 86.8 mg/dL — ABNORMAL HIGH (ref 0.0–40.0)

## 2012-04-21 LAB — LDL CHOLESTEROL, DIRECT: Direct LDL: 91.8 mg/dL

## 2012-05-07 MED ORDER — FENOFIBRATE 145 MG PO TABS: 145.0000 mg | ORAL_TABLET | Freq: Every day | ORAL | Status: DC

## 2012-05-07 NOTE — Addendum Note (Signed)
Addended by: Duncan Dull on: 05/07/2012 09:17 AM   Modules accepted: Orders

## 2012-05-15 ENCOUNTER — Telehealth: Payer: Self-pay | Admitting: Internal Medicine

## 2012-05-15 MED ORDER — INSULIN SYRINGES (DISPOSABLE) U-100 1 ML MISC: Status: DC

## 2012-05-15 NOTE — Telephone Encounter (Signed)
Insulin syrg 1 ml/3 mis 100 use as directed.

## 2012-06-26 ENCOUNTER — Ambulatory Visit: Payer: Self-pay | Admitting: Internal Medicine

## 2012-06-27 ENCOUNTER — Ambulatory Visit (INDEPENDENT_AMBULATORY_CARE_PROVIDER_SITE_OTHER): Payer: BC Managed Care – PPO | Admitting: Internal Medicine

## 2012-06-27 ENCOUNTER — Encounter: Payer: Self-pay | Admitting: Internal Medicine

## 2012-06-27 VITALS — BP 120/72 | HR 70 | Temp 98.4°F | Ht 67.5 in | Wt 204.0 lb

## 2012-06-27 DIAGNOSIS — Z23 Encounter for immunization: Secondary | ICD-10-CM

## 2012-06-27 DIAGNOSIS — I10 Essential (primary) hypertension: Secondary | ICD-10-CM

## 2012-06-27 DIAGNOSIS — E785 Hyperlipidemia, unspecified: Secondary | ICD-10-CM

## 2012-06-27 DIAGNOSIS — E1129 Type 2 diabetes mellitus with other diabetic kidney complication: Secondary | ICD-10-CM

## 2012-06-27 DIAGNOSIS — N760 Acute vaginitis: Secondary | ICD-10-CM | POA: Insufficient documentation

## 2012-06-27 DIAGNOSIS — N189 Chronic kidney disease, unspecified: Secondary | ICD-10-CM

## 2012-06-27 DIAGNOSIS — E1122 Type 2 diabetes mellitus with diabetic chronic kidney disease: Secondary | ICD-10-CM

## 2012-06-27 MED ORDER — FLUCONAZOLE 150 MG PO TABS: 150.0000 mg | ORAL_TABLET | Freq: Every day | ORAL | Status: DC

## 2012-06-27 NOTE — Progress Notes (Signed)
Patient ID: SIRA ADSIT, female   DOB: 1955/11/15, 56 y.o.   MRN: [id number]  Patient Active Problem List  Diagnosis  . Diabetes mellitus with chronic kidney disease  . HYPERLIPIDEMIA  . HYPERTENSION  . Hypothyroidism  . Obesity (BMI 30-39.9)  . Vaginitis    Subjective:  CC:   Chief Complaint  Patient presents with  . Follow-up    HPI:   Pamela Harris a 56 y.o. female who presents for Diabetes follow up.  Patient notices that her blood sugars have been elevated in the am from 80 to now 150 since starting fenofibrate.  She is also some symptoms of mild gastritis with occasional twinges of nausea and occasional reflux of bilious tasting material  since starting it.  She recalls having similar symptoms with prior trial of trilipix.  No vomiting or myalgias, but  She developed medial  Ankle pain on the left accompanied by welling after walking all day at the [employer]  over the weekend despite wearing orthopeidc  shoes.,  The swelling has improved.   She is not due for 3 month follow up labs until Nov 16 and sees nephrology on dec 16th so she was given rx for hgba1c fasting lipids and CMEt to do then     Having some vaginal burning and an  inguinal rash.    Past Medical History  Diagnosis Date  . Hyperlipidemia   . Hypertension   . Diabetes mellitus     History reviewed. No pertinent past surgical history.       The following portions of the patient's history were reviewed and updated as appropriate: Allergies, current medications, and problem list.    Review of Systems:   12 Pt  review of systems was negative except those addressed in the HPI,     History   Social History  . Marital Status: Married    Spouse Name: N/A    Number of Children: N/A  . Years of Education: N/A   Occupational History  . Not on file.   Social History Main Topics  . Smoking status: Never Smoker   . Smokeless tobacco: Never Used  . Alcohol Use: No  . Drug Use:  No  . Sexually Active: Not on file   Other Topics Concern  . Not on file   Social History Narrative  . No narrative on file    Objective:  BP 120/72  Pulse 70  Temp 98.4 F (36.9 C) (Oral)  Ht 5' 7.5" (1.715 m)  Wt 204 lb (92.534 kg)  BMI 31.48 kg/m2  SpO2 98%  LMP 06/12/2012  General appearance: alert, cooperative and appears stated age Ears: normal TM's and external ear canals both ears Throat: lips, mucosa, and tongue normal; teeth and gums normal Neck: no adenopathy, no carotid bruit, supple, symmetrical, trachea midline and thyroid not enlarged, symmetric, no tenderness/mass/nodules Back: symmetric, no curvature. ROM normal. No CVA tenderness. Lungs: clear to auscultation bilaterally Heart: regular rate and rhythm, S1, S2 normal, no murmur, click, rub or gallop Abdomen: soft, non-tender; bowel sounds normal; no masses,  no organomegaly Pulses: 2+ and symmetric Skin: Skin color, texture, turgor normal. No rashes or lesions Lymph nodes: Cervical, supraclavicular, and axillary nodes normal.  Assessment and Plan:  Diabetes mellitus with chronic kidney disease Historically well controlled. Last hemoglobin A1c was 6.6 in September. Repeat is due but she would like to combine her blood draw with the nephrologists in mid December. No changes today. She has stable  renal function, GFR 71 minimal nephropathy. She is up-to-date with eye exams and foot exams.  HYPERLIPIDEMIA Primarily elevated triglycerides. She reports recurrence of mild nausea symptoms that she had previously on fenofibrate therapy. She is willing however to stay on the medication at this time.  HYPERTENSION Well controlled on current regimen. Renal function stable, no changes today.  Vaginitis Her Pap smear niFebruary was positive for Candida .  She is having recurrence of symptoms. Will retreat for consult.   Updated Medication List Outpatient Encounter Prescriptions as of 06/27/2012  Medication Sig  Dispense Refill  . amLODipine (NORVASC) 10 MG tablet Take 1 tablet (10 mg total) by mouth daily.  90 tablet  3  . atenolol (TENORMIN) 50 MG tablet Take 1 tablet (50 mg total) by mouth daily.  90 tablet  3  . fenofibrate (TRICOR) 145 MG tablet Take 1 tablet (145 mg total) by mouth daily.  90 tablet  3  . insulin NPH-insulin regular (NOVOLIN 70/30) (70-30) 100 UNIT/ML injection Inject 60 Units into the skin daily with breakfast. 80 units in the evening.  10 mL  5  . Insulin Syringes, Disposable, U-100 1 ML MISC Use as directed  100 each  6  . losartan (COZAAR) 100 MG tablet Take 1 tablet (100 mg total) by mouth daily.  90 tablet  3  . Multiple Vitamin (MULTIVITAMIN) tablet Take 1 tablet by mouth daily.        [name] nystatin cream (MYCOSTATIN) Apply topically as needed for dry skin.  30 g  1  . Vitamin D, Ergocalciferol, (DRISDOL) 50000 UNITS CAPS Take 50,000 Units by mouth every 7 (seven) days.      . fluconazole (DIFLUCAN) 150 MG tablet Take 1 tablet (150 mg total) by mouth daily.  2 tablet  0     Orders Placed This Encounter  Procedures  . HM MAMMOGRAPHY  . Flu vaccine greater than or equal to 3yo preservative free IM  . Tdap vaccine greater than or equal to 7yo IM  . HM PAP SMEAR    No Follow-up on file.

## 2012-06-29 ENCOUNTER — Encounter: Payer: Self-pay | Admitting: Internal Medicine

## 2012-06-29 NOTE — Assessment & Plan Note (Signed)
Her Pap smear niFebruary was positive for Candida .  She is having recurrence of symptoms. Will retreat for consult.

## 2012-06-29 NOTE — Assessment & Plan Note (Addendum)
Historically well controlled. Last hemoglobin A1c was 6.6 in September. Repeat is due but she would like to combine her blood draw with the nephrologists in mid December. No changes today. She has stable renal function, GFR 71 minimal nephropathy. She is up-to-date with eye exams and foot exams.

## 2012-06-29 NOTE — Assessment & Plan Note (Signed)
Well controlled on current regimen. Renal function stable, no changes today. 

## 2012-06-29 NOTE — Assessment & Plan Note (Signed)
Primarily elevated triglycerides. She reports recurrence of mild nausea symptoms that she had previously on fenofibrate therapy. She is willing however to stay on the medication at this time.

## 2012-07-13 ENCOUNTER — Encounter: Payer: Self-pay | Admitting: Internal Medicine

## 2012-07-17 ENCOUNTER — Other Ambulatory Visit: Payer: Self-pay

## 2012-07-17 DIAGNOSIS — I10 Essential (primary) hypertension: Secondary | ICD-10-CM

## 2012-07-17 MED ORDER — LOSARTAN POTASSIUM 100 MG PO TABS: 100.0000 mg | ORAL_TABLET | Freq: Every day | ORAL | Status: DC

## 2012-07-17 MED ORDER — AMLODIPINE BESYLATE 10 MG PO TABS: 10.0000 mg | ORAL_TABLET | Freq: Every day | ORAL | Status: DC

## 2012-07-17 MED ORDER — ATENOLOL 50 MG PO TABS: 50.0000 mg | ORAL_TABLET | Freq: Every day | ORAL | Status: DC

## 2012-07-17 NOTE — Telephone Encounter (Signed)
Refill request for Losartan 100 mg # 90 3 R ,Amlodipine 10 mg # 90 3 R, Atenolol 50 mg # 90 3 R sent electronic to [facility].

## 2012-09-29 ENCOUNTER — Encounter: Payer: Self-pay | Admitting: Internal Medicine

## 2012-09-29 ENCOUNTER — Ambulatory Visit (INDEPENDENT_AMBULATORY_CARE_PROVIDER_SITE_OTHER): Payer: BC Managed Care – PPO | Admitting: Internal Medicine

## 2012-09-29 VITALS — BP 122/70 | HR 75 | Temp 98.2°F | Resp 16 | Wt 208.5 lb

## 2012-09-29 DIAGNOSIS — E669 Obesity, unspecified: Secondary | ICD-10-CM

## 2012-09-29 DIAGNOSIS — N189 Chronic kidney disease, unspecified: Secondary | ICD-10-CM

## 2012-09-29 DIAGNOSIS — E1122 Type 2 diabetes mellitus with diabetic chronic kidney disease: Secondary | ICD-10-CM

## 2012-09-29 DIAGNOSIS — E119 Type 2 diabetes mellitus without complications: Secondary | ICD-10-CM

## 2012-09-29 DIAGNOSIS — E781 Pure hyperglyceridemia: Secondary | ICD-10-CM

## 2012-09-29 DIAGNOSIS — I10 Essential (primary) hypertension: Secondary | ICD-10-CM

## 2012-09-29 DIAGNOSIS — E1129 Type 2 diabetes mellitus with other diabetic kidney complication: Secondary | ICD-10-CM

## 2012-09-29 LAB — COMPREHENSIVE METABOLIC PANEL
ALT: 20 U/L (ref 0–35)
AST: 18 U/L (ref 0–37)
BUN: 19 mg/dL (ref 6–23)
Calcium: 9.2 mg/dL (ref 8.4–10.5)
Chloride: 102 mEq/L (ref 96–112)
Creatinine, Ser: 1 mg/dL (ref 0.4–1.2)
GFR: 59.16 mL/min — ABNORMAL LOW (ref >60.00)
Total Bilirubin: 0.5 mg/dL (ref 0.3–1.2)

## 2012-09-29 LAB — LIPID PANEL
Cholesterol: 282 mg/dL — ABNORMAL HIGH (ref 0–200)
HDL: 37.9 mg/dL — ABNORMAL LOW (ref >39.00)
Total CHOL/HDL Ratio: 7
Triglycerides: 946 mg/dL — ABNORMAL HIGH (ref 0.0–149.0)

## 2012-09-29 LAB — HEMOGLOBIN A1C: Hgb A1c MFr Bld: 7.5 % — ABNORMAL HIGH (ref 4.6–6.5)

## 2012-09-29 NOTE — Progress Notes (Signed)
Patient ID: Pamela Harris, female   DOB: 03-09-1956, 57 y.o.   MRN: [id number]  Patient Active Problem List  Diagnosis  . Diabetes mellitus with chronic kidney disease  . Hypertriglyceridemia without hypercholesterolemia  . HYPERTENSION  . Hypothyroidism  . Obesity (BMI 30-39.9)  . Vaginitis    Subjective:  CC:   Chief Complaint  Patient presents with  . Follow-up    HPI:   Pamela Harris a 57 y.o. female who presents 3 month follow up on diabetes mellitus,  Hyperlipidemia, hypertension and obesity.  Her Morning sugars have been running 90 to 160.  She discontinued the  fenofibrate due to persistent gastritis in early December.  Her abdominal symptoms have resolved.  She has seen her nephrologist within the past 6 months and cr has been stable. Denies hypoglycemic  Events,  No numbness or tingling.  She is not exercising regularly and has gained a few lbs over the holidays .    Past Medical History  Diagnosis Date  . Hyperlipidemia   . Hypertension   . Diabetes mellitus     History reviewed. No pertinent past surgical history.     The following portions of the patient's history were reviewed and updated as appropriate: Allergies, current medications, and problem list.    Review of Systems:   Patient denies headache, fevers, malaise, unintentional weight loss, skin rash, eye pain, sinus congestion and sinus pain, sore throat, dysphagia,  hemoptysis , cough, dyspnea, wheezing, chest pain, palpitations, orthopnea, edema, abdominal pain, nausea, melena, diarrhea, constipation, flank pain, dysuria, hematuria, urinary  Frequency, nocturia, numbness, tingling, seizures,  Focal weakness, Loss of consciousness,  Tremor, insomnia, depression, anxiety, and suicidal ideation.     History   Social History  . Marital Status: Married    Spouse Name: N/A    Number of Children: N/A  . Years of Education: N/A   Occupational History  . Not on file.   Social History  Main Topics  . Smoking status: Never Smoker   . Smokeless tobacco: Never Used  . Alcohol Use: No  . Drug Use: No  . Sexually Active: Not on file   Other Topics Concern  . Not on file   Social History Narrative  . No narrative on file   Family History  Problem Relation Age of Onset  . Diabetes Mother   . Diabetes Father   . Coronary artery disease Father   . Cancer Maternal Aunt     Ovarian  . Heart disease Maternal Grandfather   . Heart disease Paternal Grandfather     MI   Objective:  BP 122/70  Pulse 75  Temp(Src) 98.2 F (36.8 C) (Oral)  Resp 16  Wt 208 lb 8 oz (94.575 kg)  BMI 32.15 kg/m2  SpO2 98%  LMP 08/27/2012  General appearance: alert, cooperative and appears stated age Ears: normal TM's and external ear canals both ears Throat: lips, mucosa, and tongue normal; teeth and gums normal Neck: no adenopathy, no carotid bruit, supple, symmetrical, trachea midline and thyroid not enlarged, symmetric, no tenderness/mass/nodules Back: symmetric, no curvature. ROM normal. No CVA tenderness. Lungs: clear to auscultation bilaterally Heart: regular rate and rhythm, S1, S2 normal, no murmur, click, rub or gallop Abdomen: soft, non-tender; bowel sounds normal; no masses,  no organomegaly Pulses: 2+ and symmetric Skin: Skin color, texture, turgor normal. No rashes or lesions Lymph nodes: Cervical, supraclavicular, and axillary nodes normal.  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  Patient  stopped fenofibrate due to adverse GI effects.  Will recommend fenofibrate givdn her increased risk of pancreatitis.  Diabetes mellitus with chronic kidney disease A!c has risen to 7.5 on current therapy. Of 70/30 insuin bid (70 units in the evening ,  85 in the evening ).  Patient is only reporting her fasting sugars .  will ask her to submit logs every 2 weeks with at least 2 reaadings daily so doses can be adjusted,  Her  renal function is stable.  Foot exam is normal,  No neuropathy.  Adding baby aspirin daily .   Obesity (BMI 30-39.9) BMI is unchanged and actually a liitle worse.  I have addressed  BMI and recommended a low glycemic index diet utilizing smaller more frequent meals to increase metabolism.  I have also recommended that patient start exercising with a goal of 30 minutes of aerobic exercise a minimum of 5 days per week.    HYPERTENSION Well controlled on current regimen. Renal function stable, no changes today.   Updated Medication List Outpatient Encounter Prescriptions as of 09/29/2012  Medication Sig Dispense Refill  . amLODipine (NORVASC) 10 MG tablet Take 1 tablet (10 mg total) by mouth daily.  90 tablet  3  . atenolol (TENORMIN) 50 MG tablet Take 1 tablet (50 mg total) by mouth daily.  90 tablet  3  . insulin NPH-insulin regular (NOVOLIN 70/30) (70-30) 100 UNIT/ML injection Inject 70 Units into the skin daily with breakfast. 85 units in the evening.      . Insulin Syringes, Disposable, U-100 1 ML MISC Use as directed  100 each  6  . losartan (COZAAR) 100 MG tablet Take 1 tablet (100 mg total) by mouth daily.  90 tablet  3  . Multiple Vitamin (MULTIVITAMIN) tablet Take 1 tablet by mouth daily.        . Vitamin D, Ergocalciferol, (DRISDOL) 50000 UNITS CAPS Take 50,000 Units by mouth every 7 (seven) days.      . [DISCONTINUED] insulin NPH-insulin regular (NOVOLIN 70/30) (70-30) 100 UNIT/ML injection Inject 60 Units into the skin daily with breakfast. 80 units in the evening.  10 mL  5  . fenofibrate (TRICOR) 145 MG tablet Take 1 tablet (145 mg total) by mouth daily.  90 tablet  3  . fluconazole (DIFLUCAN) 150 MG tablet Take 1 tablet (150 mg total) by mouth daily.  2 tablet  0   No facility-administered encounter medications on file as of 09/29/2012.     Orders Placed This Encounter  Procedures  . LDL cholesterol, direct    Return in about 3 months (around 12/27/2012).

## 2012-09-29 NOTE — Patient Instructions (Addendum)
Please start taking a baby aspirin daily to protect your heart   Your feet are in excellent shape  [employer] pasta is low carb and great tasting

## 2012-09-30 MED ORDER — GEMFIBROZIL 600 MG PO TABS: 600.0000 mg | ORAL_TABLET | Freq: Two times a day (BID) | ORAL | Status: DC

## 2012-09-30 NOTE — Assessment & Plan Note (Signed)
BMI is unchanged and actually a liitle worse.  I have addressed  BMI and recommended a low glycemic index diet utilizing smaller more frequent meals to increase metabolism.  I have also recommended that patient start exercising with a goal of 30 minutes of aerobic exercise a minimum of 5 days per week.

## 2012-09-30 NOTE — Assessment & Plan Note (Signed)
Well controlled on current regimen. Renal function stable, no changes today. 

## 2012-09-30 NOTE — Addendum Note (Signed)
Addended by: Sherlene Shams on: 09/30/2012 02:18 PM   Modules accepted: Orders

## 2012-09-30 NOTE — Assessment & Plan Note (Signed)
Severe, without treatment.  Patient stopped fenofibrate due to adverse GI effects.  Will recommend fenofibrate givdn her increased risk of pancreatitis.

## 2012-09-30 NOTE — Assessment & Plan Note (Addendum)
A!c has risen to 7.5 on current therapy. Of 70/30 insuin bid (70 units in the evening ,  85 in the evening ).  Patient is only reporting her fasting sugars .  will ask her to submit logs every 2 weeks with at least 2 reaadings daily so doses can be adjusted,  Her renal function is stable.  Foot exam is normal,  No neuropathy.  Adding baby aspirin daily .

## 2012-12-27 ENCOUNTER — Encounter: Payer: Self-pay | Admitting: Internal Medicine

## 2012-12-27 ENCOUNTER — Other Ambulatory Visit (HOSPITAL_COMMUNITY)
Admission: RE | Admit: 2012-12-27 | Discharge: 2012-12-27 | Disposition: A | Discharge status: Home / Self Care | Payer: BC Managed Care – PPO | Source: Ambulatory Visit | Attending: Internal Medicine | Admitting: Internal Medicine

## 2012-12-27 ENCOUNTER — Ambulatory Visit (INDEPENDENT_AMBULATORY_CARE_PROVIDER_SITE_OTHER): Payer: BC Managed Care – PPO | Admitting: Internal Medicine

## 2012-12-27 VITALS — BP 126/70 | HR 74 | Temp 98.2°F | Resp 16 | Ht 67.0 in | Wt 202.5 lb

## 2012-12-27 DIAGNOSIS — I10 Essential (primary) hypertension: Secondary | ICD-10-CM

## 2012-12-27 DIAGNOSIS — E1122 Type 2 diabetes mellitus with diabetic chronic kidney disease: Secondary | ICD-10-CM

## 2012-12-27 DIAGNOSIS — E1129 Type 2 diabetes mellitus with other diabetic kidney complication: Secondary | ICD-10-CM

## 2012-12-27 DIAGNOSIS — E781 Pure hyperglyceridemia: Secondary | ICD-10-CM

## 2012-12-27 DIAGNOSIS — Z01419 Encounter for gynecological examination (general) (routine) without abnormal findings: Secondary | ICD-10-CM | POA: Insufficient documentation

## 2012-12-27 DIAGNOSIS — E669 Obesity, unspecified: Secondary | ICD-10-CM

## 2012-12-27 DIAGNOSIS — E1059 Type 1 diabetes mellitus with other circulatory complications: Secondary | ICD-10-CM

## 2012-12-27 DIAGNOSIS — Z0000 Encounter for general adult medical examination without abnormal findings: Secondary | ICD-10-CM

## 2012-12-27 DIAGNOSIS — N189 Chronic kidney disease, unspecified: Secondary | ICD-10-CM

## 2012-12-27 DIAGNOSIS — Z124 Encounter for screening for malignant neoplasm of cervix: Secondary | ICD-10-CM

## 2012-12-27 DIAGNOSIS — Z1151 Encounter for screening for human papillomavirus (HPV): Secondary | ICD-10-CM | POA: Insufficient documentation

## 2012-12-27 LAB — MICROALBUMIN / CREATININE URINE RATIO
Creatinine,U: 242.4 mg/dL
Microalb Creat Ratio: 0.6 mg/g (ref 0.0–30.0)
Microalb, Ur: 1.4 mg/dL (ref 0.0–1.9)

## 2012-12-27 LAB — COMPREHENSIVE METABOLIC PANEL
AST: 13 U/L (ref 0–37)
BUN: 16 mg/dL (ref 6–23)
Calcium: 9 mg/dL (ref 8.4–10.5)
Chloride: 105 mEq/L (ref 96–112)
Creatinine, Ser: 1.2 mg/dL (ref 0.4–1.2)
GFR: 51.02 mL/min — ABNORMAL LOW (ref >60.00)
Glucose, Bld: 125 mg/dL — ABNORMAL HIGH (ref 70–99)

## 2012-12-27 LAB — LIPID PANEL
Cholesterol: 281 mg/dL — ABNORMAL HIGH (ref 0–200)
Total CHOL/HDL Ratio: 7
Triglycerides: 335 mg/dL — ABNORMAL HIGH (ref 0.0–149.0)

## 2012-12-27 NOTE — Patient Instructions (Addendum)
We wil le mail you the results of your labs  Return in 3 months

## 2012-12-29 DIAGNOSIS — Z0000 Encounter for general adult medical examination without abnormal findings: Secondary | ICD-10-CM | POA: Insufficient documentation

## 2012-12-29 NOTE — Assessment & Plan Note (Signed)
Uncontrolled by today's a1c.  Will bring patient back to discuss additional therapy. Reminder for annual diabetic eye exam given and follow up with nephrology..  Foot exam done. Meds reviewed and she is on a baby aspirin daily.and an ACE inhibitor.  Urine tested for protein.

## 2012-12-29 NOTE — Assessment & Plan Note (Signed)
I have addressed  BMI and recommended a low glycemic index diet utilizing smaller more frequent meals to increase metabolism.  I have also recommended that patient start exercising with a goal of 30 minutes of aerobic exercise a minimum of 5 days per week.  

## 2012-12-29 NOTE — Progress Notes (Signed)
Patient ID: Pamela Harris, female   DOB: 1955/10/26, 57 y.o.   MRN: [id number]  Subjective:     Pamela Harris is a 57 y.o. female and is here for a comprehensive physical exam. The patient reports no problems.  History   Social History  . Marital Status: Married    Spouse Name: N/A    Number of Children: N/A  . Years of Education: N/A   Occupational History  . Not on file.   Social History Main Topics  . Smoking status: Never Smoker   . Smokeless tobacco: Never Used  . Alcohol Use: No  . Drug Use: No  . Sexually Active: Not on file   Other Topics Concern  . Not on file   Social History Narrative  . No narrative on file   Health Maintenance  Topic Date Due  . Influenza Vaccine  04/23/2013  . Mammogram  06/26/2014  . Pap Smear  09/29/2014  . Colonoscopy  05/24/2019  . Tetanus/tdap  06/27/2022    The following portions of the patient's history were reviewed and updated as appropriate: allergies, current medications, past family history, past medical history, past social history, past surgical history and problem list.  Review of Systems A comprehensive review of systems was negative.   Objective:   BP 126/70  Pulse 74  Temp(Src) 98.2 F (36.8 C) (Oral)  Resp 16  Ht 5\' 7"  (1.702 m)  Wt 202 lb 8 oz (91.853 kg)  BMI 31.71 kg/m2  SpO2 98%  LMP 08/30/2012  General Appearance:    Alert, cooperative, no distress, appears stated age  Head:    Normocephalic, without obvious abnormality, atraumatic  Eyes:    PERRL, conjunctiva/corneas clear, EOM's intact, fundi    benign, both eyes  Ears:    Normal TM's and external ear canals, both ears  Nose:   Nares normal, septum midline, mucosa normal, no drainage    or sinus tenderness  Throat:   Lips, mucosa, and tongue normal; teeth and gums normal  Neck:   Supple, symmetrical, trachea midline, no adenopathy;    thyroid:  no enlargement/tenderness/nodules; no carotid   bruit or JVD  Back:     Symmetric, no  curvature, ROM normal, no CVA tenderness  Lungs:     Clear to auscultation bilaterally, respirations unlabored  Chest Wall:    No tenderness or deformity   Heart:    Regular rate and rhythm, S1 and S2 normal, no murmur, rub   or gallop  Breast Exam:    No tenderness, masses, or nipple abnormality  Abdomen:     Soft, non-tender, bowel sounds active all four quadrants,    no masses, no organomegaly  Genitalia:    Pelvic: cervix normal in appearance, external genitalia normal, no adnexal masses or tenderness, no cervical motion tenderness, rectovaginal septum normal, uterus normal size, shape, and consistency and vagina normal without discharge  Extremities:   Extremities normal, atraumatic, no cyanosis or edema  Pulses:   2+ and symmetric all extremities  Skin:   Skin color, texture, turgor normal, no rashes or lesions  Lymph nodes:   Cervical, supraclavicular, and axillary nodes normal  Neurologic:   CNII-XII intact, normal strength, sensation and reflexes    Throughout including microfilament test of feet.    Assessment and Plan: Routine general medical examination at a health care facility Annual comprehensive exam was done including breast, pelvic and PAP smear. All screenings have been addressed .   Obesity (BMI 30-39.9) I  have addressed  BMI and recommended a low glycemic index diet utilizing smaller more frequent meals to increase metabolism.  I have also recommended that patient start exercising with a goal of 30 minutes of aerobic exercise a minimum of 5 days per week.   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  Will discuss resuming therapy at next visit given elevated trigs on today's labs  HYPERTENSION Well controlled on current regimen. Renal function stable, no changes today.  Diabetes mellitus with chronic kidney disease Uncontrolled by today's a1c.  Will bring patient  back to discuss additional therapy. Reminder for annual diabetic eye exam given and follow up with nephrology..  Foot exam done. Meds reviewed and she is on a baby aspirin daily.and an ACE inhibitor.  Urine tested for protein.     Updated Medication List Outpatient Encounter Prescriptions as of 12/27/2012  Medication Sig Dispense Refill  . amLODipine (NORVASC) 10 MG tablet Take 1 tablet (10 mg total) by mouth daily.  90 tablet  3  . atenolol (TENORMIN) 50 MG tablet Take 1 tablet (50 mg total) by mouth daily.  90 tablet  3  . insulin NPH-insulin regular (NOVOLIN 70/30) (70-30) 100 UNIT/ML injection Inject 70 Units into the skin daily with breakfast. 85 units in the evening.      . Insulin Syringes, Disposable, U-100 1 ML MISC Use as directed  100 each  6  . losartan (COZAAR) 100 MG tablet Take 1 tablet (100 mg total) by mouth daily.  90 tablet  3  . Vitamin D, Ergocalciferol, (DRISDOL) 50000 UNITS CAPS Take 50,000 Units by mouth every 7 (seven) days.      . fenofibrate (TRICOR) 145 MG tablet Take 1 tablet (145 mg total) by mouth daily.  90 tablet  3  . fluconazole (DIFLUCAN) 150 MG tablet Take 1 tablet (150 mg total) by mouth daily.  2 tablet  0  . gemfibrozil (LOPID) 600 MG tablet Take 1 tablet (600 mg total) by mouth 2 (two) times daily before a meal.  60 tablet  5  . Multiple Vitamin (MULTIVITAMIN) tablet Take 1 tablet by mouth daily.         No facility-administered encounter medications on file as of 12/27/2012.

## 2012-12-29 NOTE — Assessment & Plan Note (Signed)
She has been statin intolerant and reluctant to continue medications for elevated triglycerides.  Will discuss resuming therapy at next visit given elevated trigs on today's labs

## 2012-12-29 NOTE — Assessment & Plan Note (Signed)
Annual comprehensive exam was done including breast, pelvic and PAP smear. All screenings have been addressed .  

## 2012-12-29 NOTE — Assessment & Plan Note (Signed)
Well controlled on current regimen. Renal function stable, no changes today. 

## 2013-01-01 ENCOUNTER — Encounter: Payer: Self-pay | Admitting: *Deleted

## 2013-01-30 ENCOUNTER — Ambulatory Visit (INDEPENDENT_AMBULATORY_CARE_PROVIDER_SITE_OTHER): Payer: BC Managed Care – PPO | Admitting: Internal Medicine

## 2013-01-30 ENCOUNTER — Encounter: Payer: Self-pay | Admitting: Internal Medicine

## 2013-01-30 VITALS — BP 140/72 | HR 75 | Temp 98.2°F | Resp 14

## 2013-01-30 DIAGNOSIS — N189 Chronic kidney disease, unspecified: Secondary | ICD-10-CM

## 2013-01-30 DIAGNOSIS — E1122 Type 2 diabetes mellitus with diabetic chronic kidney disease: Secondary | ICD-10-CM

## 2013-01-30 DIAGNOSIS — E1129 Type 2 diabetes mellitus with other diabetic kidney complication: Secondary | ICD-10-CM

## 2013-01-30 MED ORDER — INSULIN NPH (HUMAN) (ISOPHANE) 100 UNIT/ML ~~LOC~~ SUSP: 40.0000 [IU] | Freq: Two times a day (BID) | SUBCUTANEOUS | Status: DC

## 2013-01-30 NOTE — Progress Notes (Signed)
Patient ID: Pamela Harris, female   DOB: 01-14-1956, 57 y.o.   MRN: [id number]  Patient Active Problem List   Diagnosis Date Noted  . Routine general medical examination at a health care facility 12/29/2012  . Vaginitis 06/27/2012  . Obesity (BMI 30-39.9) 03/26/2012  . Hypothyroidism 06/20/2011  . Diabetes mellitus with chronic kidney disease 03/14/2007  . Hypertriglyceridemia without hypercholesterolemia 03/14/2007  . HYPERTENSION 03/14/2007    Subjective:  CC:   Chief Complaint  Patient presents with  . Follow-up    HPI:   Pamela Harris is a 57 y.o. female who presents as a new patient to establish primary care with the chief complaint of   Past Medical History  Diagnosis Date  . Hyperlipidemia   . Hypertension   . Diabetes mellitus     History reviewed. No pertinent past surgical history.  Family History  Problem Relation Age of Onset  . Diabetes Mother   . Diabetes Father   . Coronary artery disease Father   . Cancer Maternal Aunt     Ovarian  . Heart disease Maternal Grandfather   . Heart disease Paternal Grandfather     MI    History   Social History  . Marital Status: Married    Spouse Name: N/A    Number of Children: N/A  . Years of Education: N/A   Occupational History  . Not on file.   Social History Main Topics  . Smoking status: Never Smoker   . Smokeless tobacco: Never Used  . Alcohol Use: No  . Drug Use: No  . Sexually Active: Not on file   Other Topics Concern  . Not on file   Social History Narrative  . No narrative on file       @ALLHX @    Review of Systems:   The remainder of the review of systems was negative except those addressed in the HPI.       Objective:  BP 140/72  Pulse 75  Temp(Src) 98.2 F (36.8 C) (Oral)  Resp 14  SpO2 98%  LMP 09/01/2012  General appearance: alert, cooperative and appears stated age Ears: normal TM's and external ear canals both ears Throat: lips, mucosa, and  tongue normal; teeth and gums normal Neck: no adenopathy, no carotid bruit, supple, symmetrical, trachea midline and thyroid not enlarged, symmetric, no tenderness/mass/nodules Back: symmetric, no curvature. ROM normal. No CVA tenderness. Lungs: clear to auscultation bilaterally Heart: regular rate and rhythm, S1, S2 normal, no murmur, click, rub or gallop Abdomen: soft, non-tender; bowel sounds normal; no masses,  no organomegaly Pulses: 2+ and symmetric Skin: Skin color, texture, turgor normal. No rashes or lesions Lymph nodes: Cervical, supraclavicular, and axillary nodes normal.  Assessment and Plan:  Diabetes mellitus with chronic kidney disease Uncontrolled due to dietary changes.  Spent 25 minutes reviewing diet and clearing up misconceptions about glycemic index of her food choices.  Changing insulin to humalog lispro twice daily starting with 10 units and NPH bid 40 units. Return in one month .   Updated Medication List Outpatient Encounter Prescriptions as of 01/30/2013  Medication Sig Dispense Refill  . amLODipine (NORVASC) 10 MG tablet Take 1 tablet (10 mg total) by mouth daily.  90 tablet  3  . atenolol (TENORMIN) 50 MG tablet Take 1 tablet (50 mg total) by mouth daily.  90 tablet  3  . gemfibrozil (LOPID) 600 MG tablet Take 1 tablet (600 mg total) by mouth 2 (two) times daily before a  meal.  60 tablet  5  . Insulin Syringes, Disposable, U-100 1 ML MISC Use as directed  100 each  6  . losartan (COZAAR) 100 MG tablet Take 1 tablet (100 mg total) by mouth daily.  90 tablet  3  . Multiple Vitamin (MULTIVITAMIN) tablet Take 1 tablet by mouth daily.        . [DISCONTINUED] insulin NPH-insulin regular (NOVOLIN 70/30) (70-30) 100 UNIT/ML injection Inject 70 Units into the skin daily with breakfast. 85 units in the evening.      . fluconazole (DIFLUCAN) 150 MG tablet Take 1 tablet (150 mg total) by mouth daily.  2 tablet  0  . insulin NPH (HUMULIN N) 100 UNIT/ML injection Inject 40  Units into the skin 2 (two) times daily.  1 vial  12  . Vitamin D, Ergocalciferol, (DRISDOL) 50000 UNITS CAPS Take 50,000 Units by mouth every 7 (seven) days.      . [DISCONTINUED] fenofibrate (TRICOR) 145 MG tablet Take 1 tablet (145 mg total) by mouth daily.  90 tablet  3   No facility-administered encounter medications on file as of 01/30/2013.     No orders of the defined types were placed in this encounter.    No Follow-up on file.

## 2013-01-30 NOTE — Patient Instructions (Addendum)
  We are stopping the 70/30 mixed insulin and using short acting humalog before breakfast and dinner  And  NPH  Every 12 hours  Start with 10 units of humalog and 40 units of NPH twice daily   Increase every 3 days by 3 units until fasting is < 120 most days,  And the pre lunch sugar is < 130

## 2013-01-30 NOTE — Assessment & Plan Note (Signed)
Uncontrolled due to dietary changes.  Spent 15 minutes reviewing diet and clearing up misconceptions about glycemic index of her food choices.  Changing insulin to humalog lispro twice daily starting with 10 units and NPH bid 40 units. Return in one month .

## 2013-02-13 ENCOUNTER — Telehealth: Payer: Self-pay | Admitting: *Deleted

## 2013-02-13 MED ORDER — GLUCOSE BLOOD VI STRP: ORAL_STRIP | Status: DC

## 2013-02-13 NOTE — Telephone Encounter (Signed)
Script sent electronically 

## 2013-02-13 NOTE — Telephone Encounter (Signed)
Accu-Chek Test Strip  #[phone] (1box)

## 2013-02-28 ENCOUNTER — Telehealth: Payer: Self-pay | Admitting: *Deleted

## 2013-02-28 DIAGNOSIS — E1165 Type 2 diabetes mellitus with hyperglycemia: Secondary | ICD-10-CM

## 2013-02-28 NOTE — Telephone Encounter (Signed)
THERE ARE 3 HUMALOG PENS.  HUMALOG KWIKPEN ,  HUMALOG 75/25 AND HUMALOG 50/50 .  Can she clarify which one sh is using and how much? I will need to specify on the rx

## 2013-02-28 NOTE — Telephone Encounter (Signed)
Patient called and stated she needs script for humalog  That you gave two sample pens and now she needs the script please advise . Patient next appointment 03/29/13.

## 2013-03-01 ENCOUNTER — Other Ambulatory Visit: Payer: Self-pay | Admitting: *Deleted

## 2013-03-01 ENCOUNTER — Telehealth: Payer: Self-pay | Admitting: Internal Medicine

## 2013-03-01 MED ORDER — INSULIN LISPRO 100 UNIT/ML KWIKPEN: 10.0000 [IU] | Freq: Two times a day (BID) | SUBCUTANEOUS | Status: DC

## 2013-03-01 MED ORDER — INSULIN SYRINGES (DISPOSABLE) U-100 1 ML MISC: Status: DC

## 2013-03-01 NOTE — Telephone Encounter (Signed)
Pt notified Rx ready for pickup 

## 2013-03-01 NOTE — Telephone Encounter (Signed)
Gave verbal for the [place] and  And pen needles.

## 2013-03-01 NOTE — Telephone Encounter (Signed)
Humalog kwik pen stated by patient.

## 2013-03-01 NOTE — Telephone Encounter (Signed)
Received electronic Rx for Humalog.   Rx was unclear for Vials or prefilled Pens?  If Pens, also need Rx for needles.  If just vial, it is fine, pt has syringes.  Please advise.

## 2013-03-01 NOTE — Telephone Encounter (Signed)
setn to [employer]

## 2013-03-07 ENCOUNTER — Telehealth: Payer: Self-pay | Admitting: *Deleted

## 2013-03-07 NOTE — Telephone Encounter (Signed)
Called [phone] fir prior authorization on the Humalog, form is being faxed over

## 2013-03-07 NOTE — Telephone Encounter (Signed)
Received PA request form, gave form to [facility]

## 2013-03-19 NOTE — Telephone Encounter (Signed)
Received a fax from [employer] Humalog KWIK INJ 100/m was APPROVED July 26,2014-July 26,2015

## 2013-03-29 ENCOUNTER — Telehealth: Payer: Self-pay | Admitting: *Deleted

## 2013-03-29 ENCOUNTER — Ambulatory Visit (INDEPENDENT_AMBULATORY_CARE_PROVIDER_SITE_OTHER): Payer: BC Managed Care – PPO | Admitting: Internal Medicine

## 2013-03-29 ENCOUNTER — Encounter: Payer: Self-pay | Admitting: Internal Medicine

## 2013-03-29 VITALS — BP 116/66 | HR 75 | Temp 98.8°F | Resp 14 | Wt 202.0 lb

## 2013-03-29 DIAGNOSIS — E538 Deficiency of other specified B group vitamins: Secondary | ICD-10-CM

## 2013-03-29 DIAGNOSIS — I10 Essential (primary) hypertension: Secondary | ICD-10-CM

## 2013-03-29 DIAGNOSIS — E1129 Type 2 diabetes mellitus with other diabetic kidney complication: Secondary | ICD-10-CM

## 2013-03-29 DIAGNOSIS — E669 Obesity, unspecified: Secondary | ICD-10-CM

## 2013-03-29 DIAGNOSIS — E1122 Type 2 diabetes mellitus with diabetic chronic kidney disease: Secondary | ICD-10-CM

## 2013-03-29 DIAGNOSIS — N189 Chronic kidney disease, unspecified: Secondary | ICD-10-CM

## 2013-03-29 DIAGNOSIS — E781 Pure hyperglyceridemia: Secondary | ICD-10-CM

## 2013-03-29 LAB — LIPID PANEL
Cholesterol: 276 mg/dL — ABNORMAL HIGH (ref 0–200)
HDL: 40.5 mg/dL (ref >39.00)
Total CHOL/HDL Ratio: 7
Triglycerides: 448 mg/dL — ABNORMAL HIGH (ref 0.0–149.0)
VLDL: 89.6 mg/dL — ABNORMAL HIGH (ref 0.0–40.0)

## 2013-03-29 LAB — COMPREHENSIVE METABOLIC PANEL
AST: 14 U/L (ref 0–37)
BUN: 18 mg/dL (ref 6–23)
Calcium: 9.4 mg/dL (ref 8.4–10.5)
Chloride: 107 mEq/L (ref 96–112)
Creatinine, Ser: 1 mg/dL (ref 0.4–1.2)
Total Bilirubin: 0.5 mg/dL (ref 0.3–1.2)

## 2013-03-29 LAB — HEMOGLOBIN A1C: Hgb A1c MFr Bld: 7.2 % — ABNORMAL HIGH (ref 4.6–6.5)

## 2013-03-29 LAB — VITAMIN B12: Vitamin B-12: 381 pg/mL (ref 211–911)

## 2013-03-29 LAB — MICROALBUMIN / CREATININE URINE RATIO
Creatinine,U: 123.8 mg/dL
Microalb Creat Ratio: 0.2 mg/g (ref 0.0–30.0)

## 2013-03-29 MED ORDER — INSULIN NPH (HUMAN) (ISOPHANE) 100 UNIT/ML ~~LOC~~ SUSP: 70.0000 [IU] | Freq: Two times a day (BID) | SUBCUTANEOUS | Status: DC

## 2013-03-29 MED ORDER — INSULIN LISPRO 100 UNIT/ML ~~LOC~~ SOLN: 10.0000 [IU] | Freq: Three times a day (TID) | SUBCUTANEOUS | Status: DC

## 2013-03-29 MED ORDER — INSULIN LISPRO 100 UNIT/ML KWIKPEN: 10.0000 [IU] | Freq: Two times a day (BID) | SUBCUTANEOUS | Status: DC

## 2013-03-29 NOTE — Assessment & Plan Note (Addendum)
a1c improved from  8.2 in may  To 7.2  Effected by change in June to NPH 40 units bid and LISPRO  10 units QAC She has subsequently  increased her NPH to to 65 unitsin the am 75 in the evening.  humalog 10 units in the   Am 15 with lunch and 30 with supper. No changes until she checks a 3 am blood sugar.  Foot exam done,  Eye exam is scheduled for next week.

## 2013-03-29 NOTE — Telephone Encounter (Signed)
Pharmacy Note:  Insulin Lispro (Humalog) 100 unit    Pen or vial?  Also is it ok to change to 90 day supply   Please clarify

## 2013-03-29 NOTE — Patient Instructions (Addendum)
Check a 3 am blood sugar next time you wake up sweaty to confirm or rule out a hypogeycemic event ( any blood sugar < 80 is bad)   If sugar is >  160, ok to continue increasing the evening NPH dose by 3 units every couple of days until morning blood sugars are consistently < 130    We'll change the humalog to the vial to save you $$$$$

## 2013-03-29 NOTE — Telephone Encounter (Signed)
Clarification given patient had requested vial which is not as expensive.

## 2013-03-29 NOTE — Progress Notes (Signed)
Patient ID: Pamela Harris, female   DOB: [birth year], 57 y.o.   MRN: [id number]  Patient Active Problem List   Diagnosis Date Noted  . Routine general medical examination at a health care facility 12/29/2012  . Obesity (BMI 30-39.9) 03/26/2012  . Diabetes mellitus with chronic kidney disease 03/14/2007  . Hypertriglyceridemia without hypercholesterolemia 03/14/2007  . HYPERTENSION 03/14/2007    Subjective:  CC:   Chief Complaint  Patient presents with  . Follow-up    HPI:   Pamela Harris a 57 y.o. female who presents 3 month follows up on type 2 DM, uncontrolled,  Hyperlipidemia and obesity.  At last visit her insulin regimen was changed and she feels that the new regimen has imporved her control.  Hover her Morning fastings have been slightly elevated.   She has not checked a 3 am cbg to rule out a rebound effect.  She has noticed the following pattern:  Her Mid day sugars are 150 to 180 if she starts off in the morning with an elevated fasting bs ,  But she has had occasional lows in the 70 range before lunch if her fastings are  under control.  She is walking 3 to 4 days per week on a treadmill. No weight change since last visit.  Eats starches 2 or 3 times per week.  No fast food on a regular basis \ No new issues    Past Medical History  Diagnosis Date  . Hyperlipidemia   . Hypertension   . Diabetes mellitus     History reviewed. No pertinent past surgical history.     The following portions of the patient's history were reviewed and updated as appropriate: Allergies, current medications, and problem list.    Review of Systems:   Patient denies headache, fevers, malaise, unintentional weight loss, skin rash, eye pain, sinus congestion and sinus pain, sore throat, dysphagia,  hemoptysis , cough, dyspnea, wheezing, chest pain, palpitations, orthopnea, edema, abdominal pain, nausea, melena, diarrhea, constipation, flank pain, dysuria, hematuria, urinary   Frequency, nocturia, numbness, tingling, seizures,  Focal weakness, Loss of consciousness,  Tremor, insomnia, depression, anxiety, and suicidal ideation.      History   Social History  . Marital Status: Married    Spouse Name: N/A    Number of Children: N/A  . Years of Education: N/A   Occupational History  . Not on file.   Social History Main Topics  . Smoking status: Never Smoker   . Smokeless tobacco: Never Used  . Alcohol Use: No  . Drug Use: No  . Sexually Active: Not on file   Other Topics Concern  . Not on file   Social History Narrative  . No narrative on file    Objective:  BP 116/66  Pulse 75  Temp(Src) 98.8 F (37.1 C) (Oral)  Resp 14  Wt 202 lb (91.627 kg)  BMI 31.63 kg/m2  SpO2 98%  General appearance: alert, cooperative and appears stated age Ears: normal TM's and external ear canals both ears Throat: lips, mucosa, and tongue normal; teeth and gums normal Neck: no adenopathy, no carotid bruit, supple, symmetrical, trachea midline and thyroid not enlarged, symmetric, no tenderness/mass/nodules Back: symmetric, no curvature. ROM normal. No CVA tenderness. Lungs: clear to auscultation bilaterally Heart: regular rate and rhythm, S1, S2 normal, no murmur, click, rub or gallop Abdomen: soft, non-tender; bowel sounds normal; no masses,  no organomegaly Pulses: 2+ and symmetric Skin: Skin color, texture, turgor normal. No rashes or lesions Lymph  nodes: Cervical, supraclavicular, and axillary nodes normal.  Assessment and Plan:  Diabetes mellitus with chronic kidney disease a1c improved from  8.2 in may  To 7.2  Effected by change in June to NPH 40 units bid and LISPRO  10 units QAC She has subsequently  increased her NPH to to 65 unitsin the am 75 in the evening.  humalog 10 units in the   Am 15 with lunch and 30 with supper. No changes until she checks a 3 am blood sugar.  Foot exam done,  Eye exam is scheduled for next week.   Hypertriglyceridemia  without hypercholesterolemia Elevated despite use of gemfibrozil.  Prior trial of fenofibrate was about the same and more expensive.  low GI diet and wt loss with daily exercie advised.   Obesity (BMI 30-39.9) Her BMI remains unchanged despite exercise. We discussed the nature and quality of her exercises well as of her diet. Usually what I find is that people are not exercising as vigorously as they should to achieve a sustained heart rate in the aerobic zone. I suggested that she consider joining a gym and using a personal trainer to help guide her efforts.   I also am advising her to get back on the low GI diet using six smaller meals a day to stimulate her metabolism.    HYPERTENSION Well controlled on current regimen. Renal function stable, no changes today.   Updated Medication List Outpatient Encounter Prescriptions as of 03/29/2013  Medication Sig Dispense Refill  . amLODipine (NORVASC) 10 MG tablet Take 1 tablet (10 mg total) by mouth daily.  90 tablet  3  . atenolol (TENORMIN) 50 MG tablet Take 1 tablet (50 mg total) by mouth daily.  90 tablet  3  . gemfibrozil (LOPID) 600 MG tablet Take 1 tablet (600 mg total) by mouth 2 (two) times daily before a meal.  60 tablet  5  . glucose blood (ACCU-CHEK AVIVA) test strip Check sugars twice daily  100 each  11  . insulin lispro (HUMALOG) 100 unit/ml SOLN Inject 10 Units into the skin 2 (two) times daily before a meal. And increase as needed for uncontrolled diabetes mellitus  15 mL  11  . insulin NPH (HUMULIN N) 100 UNIT/ML injection Inject 70 Units into the skin 2 (two) times daily at 8 am and 10 pm.  3 vial  12  . Insulin Syringes, Disposable, U-100 1 ML MISC Use as directed  100 each  6  . losartan (COZAAR) 100 MG tablet Take 1 tablet (100 mg total) by mouth daily.  90 tablet  3  . Vitamin D, Ergocalciferol, (DRISDOL) 50000 UNITS CAPS Take 50,000 Units by mouth every 7 (seven) days.      . [DISCONTINUED] insulin lispro (HUMALOG) 100 unit/ml  SOLN Inject 10 Units into the skin 2 (two) times daily before a meal. And increase as needed for uncontrolled diabetes mellitus  15 mL  11  . [DISCONTINUED] insulin NPH (HUMULIN N) 100 UNIT/ML injection Inject 40 Units into the skin 2 (two) times daily.  1 vial  12  . insulin lispro (HUMALOG) 100 UNIT/ML injection Inject 10 Units into the skin 3 (three) times daily before meals. Increase as directed by physician  10 mL  12  . [DISCONTINUED] fluconazole (DIFLUCAN) 150 MG tablet Take 1 tablet (150 mg total) by mouth daily.  2 tablet  0  . [DISCONTINUED] Multiple Vitamin (MULTIVITAMIN) tablet Take 1 tablet by mouth daily.  No facility-administered encounter medications on file as of 03/29/2013.

## 2013-03-30 ENCOUNTER — Encounter: Payer: Self-pay | Admitting: *Deleted

## 2013-03-30 ENCOUNTER — Encounter: Payer: Self-pay | Admitting: Internal Medicine

## 2013-03-30 NOTE — Assessment & Plan Note (Signed)
Well controlled on current regimen. Renal function stable, no changes today. 

## 2013-03-30 NOTE — Assessment & Plan Note (Signed)
Elevated despite use of gemfibrozil.  Prior trial of fenofibrate was about the same and more expensive.  low GI diet and wt loss with daily exercie advised.

## 2013-03-30 NOTE — Assessment & Plan Note (Signed)
Her BMI remains unchanged despite exercise. We discussed the nature and quality of her exercises well as of her diet. Usually what I find is that people are not exercising as vigorously as they should to achieve a sustained heart rate in the aerobic zone. I suggested that she consider joining a gym and using a personal trainer to help guide her efforts.   I also am advising her to get back on the low GI diet using six smaller meals a day to stimulate her metabolism.   

## 2013-04-10 ENCOUNTER — Other Ambulatory Visit: Payer: Self-pay | Admitting: *Deleted

## 2013-05-06 LAB — HM DIABETES EYE EXAM: HM Diabetic Eye Exam: NORMAL

## 2013-06-21 ENCOUNTER — Telehealth: Payer: Self-pay | Admitting: *Deleted

## 2013-06-21 NOTE — Telephone Encounter (Signed)
Refill Request  Accu-chek soft lancet

## 2013-07-02 ENCOUNTER — Other Ambulatory Visit (INDEPENDENT_AMBULATORY_CARE_PROVIDER_SITE_OTHER): Payer: BC Managed Care – PPO

## 2013-07-02 DIAGNOSIS — E781 Pure hyperglyceridemia: Secondary | ICD-10-CM

## 2013-07-04 ENCOUNTER — Encounter: Payer: Self-pay | Admitting: *Deleted

## 2013-07-06 ENCOUNTER — Ambulatory Visit (INDEPENDENT_AMBULATORY_CARE_PROVIDER_SITE_OTHER): Payer: BC Managed Care – PPO | Admitting: Internal Medicine

## 2013-07-06 ENCOUNTER — Encounter: Payer: Self-pay | Admitting: Internal Medicine

## 2013-07-06 VITALS — BP 110/60 | HR 70 | Temp 97.8°F | Resp 12 | Ht 67.0 in | Wt 204.2 lb

## 2013-07-06 DIAGNOSIS — E781 Pure hyperglyceridemia: Secondary | ICD-10-CM

## 2013-07-06 DIAGNOSIS — E1129 Type 2 diabetes mellitus with other diabetic kidney complication: Secondary | ICD-10-CM

## 2013-07-06 DIAGNOSIS — E559 Vitamin D deficiency, unspecified: Secondary | ICD-10-CM

## 2013-07-06 DIAGNOSIS — N189 Chronic kidney disease, unspecified: Secondary | ICD-10-CM

## 2013-07-06 DIAGNOSIS — E669 Obesity, unspecified: Secondary | ICD-10-CM

## 2013-07-06 DIAGNOSIS — E1122 Type 2 diabetes mellitus with diabetic chronic kidney disease: Secondary | ICD-10-CM

## 2013-07-06 DIAGNOSIS — I10 Essential (primary) hypertension: Secondary | ICD-10-CM

## 2013-07-06 DIAGNOSIS — Z23 Encounter for immunization: Secondary | ICD-10-CM

## 2013-07-06 LAB — COMPREHENSIVE METABOLIC PANEL
AST: 13 U/L (ref 0–37)
Albumin: 3.5 g/dL (ref 3.5–5.2)
Alkaline Phosphatase: 74 U/L (ref 39–117)
BUN: 14 mg/dL (ref 6–23)
Creatinine, Ser: 0.8 mg/dL (ref 0.4–1.2)
GFR: 77.84 mL/min (ref >60.00)
Glucose, Bld: 146 mg/dL — ABNORMAL HIGH (ref 70–99)
Potassium: 4.5 mEq/L (ref 3.5–5.1)
Total Bilirubin: 0.7 mg/dL (ref 0.3–1.2)

## 2013-07-06 LAB — LIPID PANEL
HDL: 50.4 mg/dL (ref >39.00)
Total CHOL/HDL Ratio: 5
VLDL: 80 mg/dL — ABNORMAL HIGH (ref 0.0–40.0)

## 2013-07-06 LAB — LDL CHOLESTEROL, DIRECT: Direct LDL: 134.4 mg/dL

## 2013-07-06 NOTE — Progress Notes (Signed)
Pre-visit discussion using our clinic review tool. No additional management support is needed unless otherwise documented below in the visit note.  

## 2013-07-06 NOTE — Patient Instructions (Addendum)
Check your sugars earlier in the evening  At time of NPH  If sugar is > 150,  Do 45 units NPH  If  Sugar  > 200,  50 units ,  And if > 250,  55 units  If sugar is < 150  Do 30 units NPH  Follow the trend of fasting glucoses   Try this for a week   Your blood pressure is fine.  We are rechecking cholesterol today and A1x

## 2013-07-06 NOTE — Progress Notes (Signed)
Patient ID: Pamela Harris, female   DOB: 10-26-1955, 57 y.o.   MRN: [id number]   Patient Active Problem List   Diagnosis Date Noted  . Routine general medical examination at a health care facility 12/29/2012  . Obesity (BMI 30-39.9) 03/26/2012  . Diabetes mellitus with chronic kidney disease 03/14/2007  . Hypertriglyceridemia without hypercholesterolemia 03/14/2007  . HYPERTENSION 03/14/2007    Subjective:  CC:   Chief Complaint  Patient presents with  . Follow-up    3 month    HPI:   Pamela Harris a 57 y.o. female who presents for 3 month follow up on DM Type 2, hyperlipidemia, hypertension and obesity.   She decreased the NPH to 55 units every 12 hours (usually 8 am and 8 pm) and uses  lispro sliding scale at each meal.  usually 5 to 10 units and breakfast and 15 at  And dinner.   She has been having fasting glucoses have been 140 to 190.  Bedtime sugars 112.  Has not checked a 3 am blood sugar yet make sure she is not bottoming out and rebounding.  Diet reviewed   Past Medical History  Diagnosis Date  . Hyperlipidemia   . Hypertension   . Diabetes mellitus     History reviewed. No pertinent past surgical history.     The following portions of the patient's history were reviewed and updated as appropriate: Allergies, current medications, and problem list.    Review of Systems:   12 Pt  review of systems was negative except those addressed in the HPI,     History   Social History  . Marital Status: Married    Spouse Name: N/A    Number of Children: N/A  . Years of Education: N/A   Occupational History  . Not on file.   Social History Main Topics  . Smoking status: Never Smoker   . Smokeless tobacco: Never Used  . Alcohol Use: No  . Drug Use: No  . Sexual Activity: Not on file   Other Topics Concern  . Not on file   Social History Narrative  . No narrative on file    Objective:  Filed Vitals:   07/06/13 0827  BP: 110/60   Pulse: 70  Temp: 97.8 F (36.6 C)  Resp: 12     General appearance: alert, cooperative and appears stated age Ears: normal TM's and external ear canals both ears Throat: lips, mucosa, and tongue normal; teeth and gums normal Neck: no adenopathy, no carotid bruit, supple, symmetrical, trachea midline and thyroid not enlarged, symmetric, no tenderness/mass/nodules Back: symmetric, no curvature. ROM normal. No CVA tenderness. Lungs: clear to auscultation bilaterally Heart: regular rate and rhythm, S1, S2 normal, no murmur, click, rub or gallop Abdomen: soft, non-tender; bowel sounds normal; no masses,  no organomegaly Pulses: 2+ and symmetric Skin: Skin color, texture, turgor normal. No rashes or lesions Lymph nodes: Cervical, supraclavicular, and axillary nodes normal.  Assessment and Plan:  Diabetes mellitus with chronic kidney disease Well-controlled on current medications.  hemoglobin A1c is now 7.1. She is up-to-date on eye exams and her foot exam is normal. l we'll repeat hierurine microalbumin to creatinine ratio at next visit. She is on the appropriate medications.  Hypertriglyceridemia without hypercholesterolemia Improved with gemfibrozil and low GI diet.  No changes today  Lab Results  Component Value Date   CHOL 257* 07/06/2013   HDL 50.40 07/06/2013   LDLDIRECT 134.4 07/06/2013   TRIG 400.0 Triglyceride is over 400;  calculations on Lipids are invalid.* 07/06/2013   CHOLHDL 5 07/06/2013    HYPERTENSION Well controlled on current regimen. Renal function stable, no changes today.  Lab Results  Component Value Date   CREATININE 0.8 07/06/2013     Obesity (BMI 30-39.9) I have addressed  BMI and recommended wt loss of 10% of body weigh over the next 6 months using a low glycemic index diet and regular exercise a minimum of 5 days per week.     Updated Medication List Outpatient Encounter Prescriptions as of 07/06/2013  Medication Sig  . amLODipine (NORVASC)  10 MG tablet Take 1 tablet (10 mg total) by mouth daily.  [name] atenolol (TENORMIN) 50 MG tablet Take 1 tablet (50 mg total) by mouth daily.  . fish oil-omega-3 fatty acids 1000 MG capsule Take 1 g by mouth 2 (two) times daily.  [name] gemfibrozil (LOPID) 600 MG tablet Take 1 tablet (600 mg total) by mouth 2 (two) times daily before a meal.  . glucose blood (ACCU-CHEK AVIVA) test strip Check sugars twice daily  . insulin lispro (HUMALOG) 100 UNIT/ML injection Inject 10 Units into the skin 3 (three) times daily before meals. Increase as directed by physician  . insulin lispro (HUMALOG) 100 unit/ml SOLN Inject 10 Units into the skin 2 (two) times daily before a meal. And increase as needed for uncontrolled diabetes mellitus  . insulin NPH (HUMULIN N) 100 UNIT/ML injection Inject 70 Units into the skin 2 (two) times daily at 8 am and 10 pm.  . Insulin Syringes, Disposable, U-100 1 ML MISC Use as directed  . losartan (COZAAR) 100 MG tablet Take 1 tablet (100 mg total) by mouth daily.  . Probiotic Product (PROBIOTIC MATURE ADULT) CAPS Take 1 capsule by mouth daily.  . Vitamin D, Ergocalciferol, (DRISDOL) 50000 UNITS CAPS Take 50,000 Units by mouth every 7 (seven) days.     Orders Placed This Encounter  Procedures  . Lipid panel  . Hemoglobin A1c  . Comprehensive metabolic panel  . Vit D  25 hydroxy (rtn osteoporosis monitoring)  . LDL cholesterol, direct  . HM DIABETES EYE EXAM  . HM DIABETES FOOT EXAM    No Follow-up on file.

## 2013-07-07 ENCOUNTER — Encounter: Payer: Self-pay | Admitting: Internal Medicine

## 2013-07-07 LAB — VITAMIN D 25 HYDROXY (VIT D DEFICIENCY, FRACTURES): Vit D, 25-Hydroxy: 36 ng/mL (ref 30–89)

## 2013-07-07 NOTE — Assessment & Plan Note (Signed)
Well controlled on current regimen. Renal function stable, no changes today.  Lab Results  Component Value Date   CREATININE 0.8 07/06/2013

## 2013-07-07 NOTE — Assessment & Plan Note (Signed)
Well-controlled on current medications.  hemoglobin A1c is now 7.1. She is up-to-date on eye exams and her foot exam is normal. l we'll repeat hierurine microalbumin to creatinine ratio at next visit. She is on the appropriate medications.

## 2013-07-07 NOTE — Assessment & Plan Note (Signed)
I have addressed  BMI and recommended wt loss of 10% of body weigh over the next 6 months using a low glycemic index diet and regular exercise a minimum of 5 days per week.   

## 2013-07-07 NOTE — Assessment & Plan Note (Signed)
Improved with gemfibrozil and low GI diet.  No changes today  Lab Results  Component Value Date   CHOL 257* 07/06/2013   HDL 50.40 07/06/2013   LDLDIRECT 134.4 07/06/2013   TRIG 400.0 Triglyceride is over 400; calculations on Lipids are invalid.* 07/06/2013   CHOLHDL 5 07/06/2013

## 2013-07-09 ENCOUNTER — Encounter: Payer: Self-pay | Admitting: *Deleted

## 2013-09-14 ENCOUNTER — Other Ambulatory Visit: Payer: Self-pay | Admitting: Internal Medicine

## 2013-10-12 ENCOUNTER — Encounter: Payer: Self-pay | Admitting: Internal Medicine

## 2013-10-12 ENCOUNTER — Encounter (INDEPENDENT_AMBULATORY_CARE_PROVIDER_SITE_OTHER): Payer: Self-pay

## 2013-10-12 ENCOUNTER — Ambulatory Visit (INDEPENDENT_AMBULATORY_CARE_PROVIDER_SITE_OTHER): Payer: BC Managed Care – PPO | Admitting: Internal Medicine

## 2013-10-12 VITALS — BP 130/72 | HR 78 | Temp 98.3°F | Resp 16 | Wt 203.8 lb

## 2013-10-12 DIAGNOSIS — Z888 Allergy status to other drugs, medicaments and biological substances status: Secondary | ICD-10-CM

## 2013-10-12 DIAGNOSIS — E669 Obesity, unspecified: Secondary | ICD-10-CM

## 2013-10-12 DIAGNOSIS — N189 Chronic kidney disease, unspecified: Secondary | ICD-10-CM

## 2013-10-12 DIAGNOSIS — I10 Essential (primary) hypertension: Secondary | ICD-10-CM

## 2013-10-12 DIAGNOSIS — Z1239 Encounter for other screening for malignant neoplasm of breast: Secondary | ICD-10-CM

## 2013-10-12 DIAGNOSIS — E1129 Type 2 diabetes mellitus with other diabetic kidney complication: Secondary | ICD-10-CM

## 2013-10-12 DIAGNOSIS — Z789 Other specified health status: Secondary | ICD-10-CM

## 2013-10-12 DIAGNOSIS — E781 Pure hyperglyceridemia: Secondary | ICD-10-CM

## 2013-10-12 DIAGNOSIS — K625 Hemorrhage of anus and rectum: Secondary | ICD-10-CM

## 2013-10-12 DIAGNOSIS — Z23 Encounter for immunization: Secondary | ICD-10-CM

## 2013-10-12 DIAGNOSIS — E119 Type 2 diabetes mellitus without complications: Secondary | ICD-10-CM

## 2013-10-12 DIAGNOSIS — Z1211 Encounter for screening for malignant neoplasm of colon: Secondary | ICD-10-CM

## 2013-10-12 DIAGNOSIS — N951 Menopausal and female climacteric states: Secondary | ICD-10-CM

## 2013-10-12 DIAGNOSIS — E1122 Type 2 diabetes mellitus with diabetic chronic kidney disease: Secondary | ICD-10-CM

## 2013-10-12 LAB — COMPREHENSIVE METABOLIC PANEL
ALK PHOS: 55 U/L (ref 39–117)
ALT: 24 U/L (ref 0–35)
AST: 19 U/L (ref 0–37)
Albumin: 4 g/dL (ref 3.5–5.2)
BUN: 17 mg/dL (ref 6–23)
CO2: 27 meq/L (ref 19–32)
CREATININE: 0.9 mg/dL (ref 0.4–1.2)
Calcium: 9.8 mg/dL (ref 8.4–10.5)
Chloride: 106 mEq/L (ref 96–112)
GFR: 67.14 mL/min (ref >60.00)
GLUCOSE: 77 mg/dL (ref 70–99)
Potassium: 4.3 mEq/L (ref 3.5–5.1)
Sodium: 140 mEq/L (ref 135–145)
TOTAL PROTEIN: 7.1 g/dL (ref 6.0–8.3)
Total Bilirubin: 0.5 mg/dL (ref 0.3–1.2)

## 2013-10-12 LAB — LIPID PANEL
CHOL/HDL RATIO: 6
Cholesterol: 265 mg/dL — ABNORMAL HIGH (ref 0–200)
HDL: 42.4 mg/dL (ref >39.00)
TRIGLYCERIDES: 279 mg/dL — AB (ref 0.0–149.0)
VLDL: 55.8 mg/dL — ABNORMAL HIGH (ref 0.0–40.0)

## 2013-10-12 LAB — TSH: TSH: 2.6 u[IU]/mL (ref 0.35–5.50)

## 2013-10-12 LAB — MICROALBUMIN / CREATININE URINE RATIO
Creatinine,U: 99.4 mg/dL
MICROALB UR: 0.9 mg/dL (ref 0.0–1.9)
Microalb Creat Ratio: 0.9 mg/g (ref 0.0–30.0)

## 2013-10-12 LAB — HM DIABETES EYE EXAM

## 2013-10-12 LAB — HEMOGLOBIN A1C: Hgb A1c MFr Bld: 8.3 % — ABNORMAL HIGH (ref 4.6–6.5)

## 2013-10-12 LAB — FOLLICLE STIMULATING HORMONE: FSH: 47.4 m[IU]/mL

## 2013-10-12 LAB — LDL CHOLESTEROL, DIRECT: Direct LDL: 178.2 mg/dL

## 2013-10-12 NOTE — Assessment & Plan Note (Addendum)
Triglycerides remain elevated on Lopid.  prior trial of fenofibrate with concurrrent alpha lipoic acid resulted in gastritis and persistent  Hunger which she has since attributed to the supplement. She is wiloling to retry fenofibrate but refuses to take statins due to mistrust of safety profile.  Using the Framingham risk calculator,  her 10 year risk of coronary artery disease is 24%.  Lab Results  Component Value Date   CHOL 265* 10/12/2013   HDL 42.40 10/12/2013   LDLDIRECT 178.2 10/12/2013   TRIG 279.0* 10/12/2013   CHOLHDL 6 10/12/2013

## 2013-10-12 NOTE — Progress Notes (Signed)
Pre-visit discussion using our clinic review tool. No additional management support is needed unless otherwise documented below in the visit note.  

## 2013-10-12 NOTE — Patient Instructions (Addendum)
Your pelvic exam was normal.  Your hemoccult test was also negative (normal)  We will check your hormone levels with your other labs today   If your fasting is under 150   Reduce your NPH and the short acting insulin doses by 3 units.  If your fasting sugar is 200 ,  Increase your NPH   By 3 units and leave the short acting insulin as is (10 units)

## 2013-10-12 NOTE — Progress Notes (Signed)
Patient ID: Pamela Harris, female   DOB: [birth year], 58 y.o.   MRN: [id number]  Patient Active Problem List   Diagnosis Date Noted  . Perimenopause 10/14/2013  . Routine general medical examination at a health care facility 12/29/2012  . Obesity (BMI 30-39.9) 03/26/2012  . Type II or unspecified type diabetes mellitus without mention of complication, uncontrolled 03/14/2007  . Hypercholesterolemia with hypertriglyceridemia 03/14/2007  . HYPERTENSION 03/14/2007    Subjective:  CC:   Chief Complaint  Patient presents with  . Follow-up    3 month/ reports bleeding when have a bowel movement X 3days   . Diabetes    HPI:   Pamela Harris is a 58 y.o. female who presents for  3 month follow up on DM, hyperlipidemia and obesity and new issues of menstrual irregularities.  Last period was January 2014.  Then in Nov 2014 had  vaginal bleeding which was darker and had a different odor accompanied  By breast swelling,  Had another bleeding episode last week accompanied by bilateral inguinal discomfort.  She has not had  intercourse since November.   No use of tampons or internal vaginal suppositories since January 2014. Has also had blood with several bowle movements over the last week.    2) DM:  Her blood sugars have been labile,  fastings have been elevated to 200 and her post prandial mid day sugars have been  Labile.  She did not bring her log with her,  Using  NPH insulin  twice daily  And short acting for mid day sugars over 200.  She checks her post lunch if her fastings are > 200.   If fasting is 150 or less she reduces the short acting .  Denies neuropathy,  Vision changes. Not exercising regularly .    Past Medical History  Diagnosis Date  . Hyperlipidemia   . Hypertension   . Diabetes mellitus     History reviewed. No pertinent past surgical history.     The following portions of the patient's history were reviewed and updated as appropriate: Allergies, current  medications, and problem list.    Review of Systems:   Patient denies headache, fevers, malaise, unintentional weight loss, skin rash, eye pain, sinus congestion and sinus pain, sore throat, dysphagia,  hemoptysis , cough, dyspnea, wheezing, chest pain, palpitations, orthopnea, edema, abdominal pain, nausea, melena, diarrhea, constipation, flank pain, dysuria, hematuria, urinary  Frequency, nocturia, numbness, tingling, seizures,  Focal weakness, Loss of consciousness,  Tremor, insomnia, depression, anxiety, and suicidal ideation.     History   Social History  . Marital Status: Married    Spouse Name: N/A    Number of Children: N/A  . Years of Education: N/A   Occupational History  . Not on file.   Social History Main Topics  . Smoking status: Never Smoker   . Smokeless tobacco: Never Used  . Alcohol Use: No  . Drug Use: No  . Sexual Activity: Not on file   Other Topics Concern  . Not on file   Social History Narrative  . No narrative on file    Objective:  Filed Vitals:   10/12/13 0929  BP: 130/72  Pulse: 78  Temp: 98.3 F (36.8 C)  Resp: 16    BP 130/72  Pulse 78  Temp(Src) 98.3 F (36.8 C) (Oral)  Resp 16  Wt 203 lb 12 oz (92.42 kg)  SpO2 99%  LMP 07/10/2013  General Appearance:    Alert, cooperative, no  distress, appears stated age  Head:    Normocephalic, without obvious abnormality, atraumatic  Eyes:    PERRL, conjunctiva/corneas clear, EOM's intact, fundi    benign, both eyes  Ears:    Normal TM's and external ear canals, both ears  Nose:   Nares normal, septum midline, mucosa normal, no drainage    or sinus tenderness  Throat:   Lips, mucosa, and tongue normal; teeth and gums normal  Neck:   Supple, symmetrical, trachea midline, no adenopathy;    thyroid:  no enlargement/tenderness/nodules; no carotid   bruit or JVD  Back:     Symmetric, no curvature, ROM normal, no CVA tenderness  Lungs:     Clear to auscultation bilaterally, respirations  unlabored  Chest Wall:    No tenderness or deformity   Heart:    Regular rate and rhythm, S1 and S2 normal, no murmur, rub   or gallop  Breast Exam:    No tenderness, masses, or nipple abnormality  Abdomen:     Soft, non-tender, bowel sounds active all four quadrants,    no masses, no organomegaly  Genitalia:    Normal female without lesion, discharge or tenderness  Rectal:    Normal tone,  no masses or tenderness;   guaiac negative stool  Extremities:   Extremities normal, atraumatic, no cyanosis or edema  Pulses:   2+ and symmetric all extremities  Skin:   Skin color, texture, turgor normal, no rashes or lesions  Lymph nodes:   Cervical, supraclavicular, and axillary nodes normal  Neurologic:   Nails are well trimmed,  No callouses,  Sensation intact to microfilament    Assessment and Plan:[name]  Hypercholesterolemia with hypertriglyceridemia Triglycerides remain elevated on Lopid.  prior trial of fenofibrate with concurrrent alpha lipoic acid resulted in gastritis and persistent  Hunger which she has since attributed to the supplement. She is wiloling to retry fenofibrate but refuses to take statins due to mistrust of safety profile.  Using the Framingham risk calculator,  her 10 year risk of coronary artery disease is 24%.  Lab Results  Component Value Date   CHOL 265* 10/12/2013   HDL 42.40 10/12/2013   LDLDIRECT 178.2 10/12/2013   TRIG 279.0* 10/12/2013   CHOLHDL 6 10/12/2013    Type II or unspecified type diabetes mellitus without mention of complication, uncontrolled Current loss of control after finally reaching control on regimen of NPH bid and sliding scale  lispro qac . Advised to increase the evening dose of NPH by 3 units every 3 days until fastings are < 130. Reminder for annual diabetic eye exam given..  Foot exam done. Meds reviewed and she is on a baby aspirin daily., refuses to take statins, and is tolerating an ACE inhibitor.  Urine tested for protein.   Lab Results   Component Value Date   HGBA1C 8.3* 10/12/2013   Lab Results  Component Value Date   MICROALBUR 0.9 10/12/2013    HYPERTENSION Well controlled on current regimen. Renal function stable, no changes today.  Lab Results  Component Value Date   CREATININE 0.9 10/12/2013   Lab Results  Component Value Date   MICROALBUR 0.9 10/12/2013    Obesity (BMI 30-39.9) Her BMI remains unchanged despite repeated advisements to begin regular exercise.  I suggested that she consider joining a gym and using a personal trainer to help guide her efforts.   I also am advising her to get back on the low GI diet using six smaller meals a day to  stimulate her metabolism.    Perimenopause pelvix exam today was normal.  Rectal exam and FOBT were also done and negative test.  Reassurance provided that her amenorrhea and irregularity were common occurrences . [place] pending.   A total of 40 minutes was spent with patient more than half of which was spent in counseling, reviewing records from other prviders and coordination of care.   Updated Medication List Outpatient Encounter Prescriptions as of 10/12/2013  Medication Sig  . amLODipine (NORVASC) 10 MG tablet TAKE ONE TABLET BY MOUTH DAILY  . atenolol (TENORMIN) 50 MG tablet TAKE ONE TABLET BY MOUTH DAILY  . fish oil-omega-3 fatty acids 1000 MG capsule Take 1 g by mouth 2 (two) times daily.  [name] gemfibrozil (LOPID) 600 MG tablet Take 1 tablet (600 mg total) by mouth 2 (two) times daily before a meal.  . glucose blood (ACCU-CHEK AVIVA) test strip Check sugars twice daily  . insulin lispro (HUMALOG) 100 UNIT/ML injection Inject 10 Units into the skin 3 (three) times daily before meals. Increase as directed by physician  . insulin NPH (HUMULIN N) 100 UNIT/ML injection Inject 70 Units into the skin 2 (two) times daily at 8 am and 10 pm.  . Insulin Syringes, Disposable, U-100 1 ML MISC Use as directed  . losartan (COZAAR) 100 MG tablet TAKE ONE TABLET BY MOUTH DAILY   . Multiple Vitamins-Minerals (MULTIVITAMIN WITH MINERALS) tablet Take 1 tablet by mouth daily.  . Probiotic Product (PROBIOTIC MATURE ADULT) CAPS Take 1 capsule by mouth daily.  . Vitamin D, Ergocalciferol, (DRISDOL) 50000 UNITS CAPS Take 50,000 Units by mouth every 7 (seven) days.  . [DISCONTINUED] insulin lispro (HUMALOG) 100 unit/ml SOLN Inject 10 Units into the skin 2 (two) times daily before a meal. And increase as needed for uncontrolled diabetes mellitus     Orders Placed This Encounter  Procedures  . MM DIGITAL SCREENING BILATERAL  . Pneumococcal polysaccharide vaccine 23-valent greater than or equal to 2yo subcutaneous/IM  . Comprehensive metabolic panel  . Hemoglobin A1c  . Lipid panel  . Microalbumin / creatinine urine ratio  . TSH  . Follicle stimulating hormone  . LDL cholesterol, direct  . Ambulatory referral to Gastroenterology  . POC Hemoccult Bld/Stl (1-Cd Office Dx)  . HM DIABETES EYE EXAM    No Follow-up on file.

## 2013-10-14 ENCOUNTER — Encounter: Payer: Self-pay | Admitting: Internal Medicine

## 2013-10-14 DIAGNOSIS — Z789 Other specified health status: Secondary | ICD-10-CM | POA: Insufficient documentation

## 2013-10-14 DIAGNOSIS — N951 Menopausal and female climacteric states: Secondary | ICD-10-CM | POA: Insufficient documentation

## 2013-10-14 MED ORDER — FENOFIBRATE 160 MG PO TABS: 160.0000 mg | ORAL_TABLET | Freq: Every day | ORAL | Status: DC

## 2013-10-14 NOTE — Assessment & Plan Note (Signed)
Well controlled on current regimen. Renal function stable, no changes today.  Lab Results  Component Value Date   CREATININE 0.9 10/12/2013   Lab Results  Component Value Date   MICROALBUR 0.9 10/12/2013

## 2013-10-14 NOTE — Assessment & Plan Note (Addendum)
Current loss of control after finally reaching control on regimen of NPH bid and sliding scale  lispro qac . Advised to increase the evening dose of NPH by 3 units every 3 days until fastings are < 130. Reminder for annual diabetic eye exam given..  Foot exam done. Meds reviewed and she is on a baby aspirin daily., refuses to take statins, and is tolerating an ACE inhibitor.  Urine tested for protein.   Lab Results  Component Value Date   HGBA1C 8.3* 10/12/2013   Lab Results  Component Value Date   MICROALBUR 0.9 10/12/2013

## 2013-10-14 NOTE — Assessment & Plan Note (Signed)
Her BMI remains unchanged despite repeated advisements to begin regular exercise.  I suggested that she consider joining a gym and using a personal trainer to help guide her efforts.   I also am advising her to get back on the low GI diet using six smaller meals a day to stimulate her metabolism.

## 2013-10-14 NOTE — Addendum Note (Signed)
Addended by: Crecencio Mc on: 10/14/2013 09:49 PM   Modules accepted: Orders, Medications

## 2013-10-14 NOTE — Assessment & Plan Note (Signed)
pelvix exam today was normal.  Rectal exam and FOBT were also done and negative test.  Reassurance provided that her amenorrhea and irregularity were common occurrences . [place] pending.

## 2013-10-15 ENCOUNTER — Telehealth: Payer: Self-pay | Admitting: Internal Medicine

## 2013-10-15 NOTE — Telephone Encounter (Signed)
Relevant patient education assigned to patient using Emmi. ° °

## 2013-10-22 ENCOUNTER — Encounter: Payer: Self-pay | Admitting: Emergency Medicine

## 2013-10-30 ENCOUNTER — Ambulatory Visit: Payer: Self-pay | Admitting: Internal Medicine

## 2013-11-29 ENCOUNTER — Encounter: Payer: Self-pay | Admitting: Internal Medicine

## 2014-01-03 ENCOUNTER — Encounter: Payer: Self-pay | Admitting: Internal Medicine

## 2014-01-03 ENCOUNTER — Ambulatory Visit (INDEPENDENT_AMBULATORY_CARE_PROVIDER_SITE_OTHER): Payer: BC Managed Care – PPO | Admitting: Internal Medicine

## 2014-01-03 VITALS — BP 124/72 | HR 82 | Temp 98.4°F | Resp 16 | Ht 67.5 in | Wt 206.8 lb

## 2014-01-03 DIAGNOSIS — IMO0001 Reserved for inherently not codable concepts without codable children: Secondary | ICD-10-CM

## 2014-01-03 DIAGNOSIS — Z0000 Encounter for general adult medical examination without abnormal findings: Secondary | ICD-10-CM

## 2014-01-03 DIAGNOSIS — E1165 Type 2 diabetes mellitus with hyperglycemia: Principal | ICD-10-CM

## 2014-01-03 DIAGNOSIS — N951 Menopausal and female climacteric states: Secondary | ICD-10-CM

## 2014-01-03 NOTE — Progress Notes (Signed)
Pre-visit discussion using our clinic review tool. No additional management support is needed unless otherwise documented below in the visit note.  

## 2014-01-03 NOTE — Patient Instructions (Signed)
You had your annual  wellness exam today.  We will repeat your PAP smear in 2016, sooner if needed     Please make an appt for fasting labs on or after May 20th  PLEASE CHECK A 3 AM BLOOD SUGAR ONCE OR TWICE IN THE NEXT WEEK SO i CAN ADJUST YOUR INSULIN SAFELY .   We will contact you with the bloodwork results

## 2014-01-03 NOTE — Progress Notes (Signed)
Patient ID: Pamela Harris, female   DOB: 07-Sep-1955, 58 y.o.   MRN: [id number]    Subjective:     Pamela Harris is a 58 y.o. female and is here for a comprehensive physical exam. The patient reports no problems.     DM:  morning fastings have been elevated , ranging from 150 to 180  Pre lunch 88 to 120  She has adjusted her insulin doses and is now taking 55 units of 55 in th am and 65 evening units of NPH  In the evening.  She is taking lispro 15 units wi breakfast,  10 units lunch and 10 to 15 units supper.      History   Social History  . Marital Status: Married    Spouse Name: N/A    Number of Children: N/A  . Years of Education: N/A   Occupational History  . Not on file.   Social History Main Topics  . Smoking status: Never Smoker   . Smokeless tobacco: Never Used  . Alcohol Use: No  . Drug Use: No  . Sexual Activity: Not on file   Other Topics Concern  . Not on file   Social History Narrative  . No narrative on file   Health Maintenance  Topic Date Due  . Influenza Vaccine  03/23/2014  . Mammogram  10/31/2015  . Pap Smear  12/28/2015  . Colonoscopy  05/24/2019  . Tetanus/tdap  06/27/2022    The following portions of the patient's history were reviewed and updated as appropriate: allergies, current medications, past family history, past medical history, past social history, past surgical history and problem list.  Review of Systems A comprehensive review of systems was negative.   Objective:   BP 124/72  Pulse 82  Temp(Src) 98.4 F (36.9 C) (Oral)  Resp 16  Ht 5' 7.5" (1.715 m)  Wt 206 lb 12 oz (93.781 kg)  BMI 31.89 kg/m2  SpO2 98%  LMP 07/02/2013  General appearance: alert, cooperative and appears stated age Head: Normocephalic, without obvious abnormality, atraumatic Eyes: conjunctivae/corneas clear. PERRL, EOM's intact. Fundi benign. Ears: normal TM's and external ear canals both ears Nose: Nares normal. Septum midline. Mucosa  normal. No drainage or sinus tenderness. Throat: lips, mucosa, and tongue normal; teeth and gums normal Neck: no adenopathy, no carotid bruit, no JVD, supple, symmetrical, trachea midline and thyroid not enlarged, symmetric, no tenderness/mass/nodules Lungs: clear to auscultation bilaterally Breasts: normal appearance, no masses or tenderness Heart: regular rate and rhythm, S1, S2 normal, no murmur, click, rub or gallop Abdomen: soft, non-tender; bowel sounds normal; no masses,  no organomegaly Extremities: extremities normal, atraumatic, no cyanosis or edema Pulses: 2+ and symmetric Skin: Skin color, texture, turgor normal. No rashes or lesions Neurologic: Alert and oriented X 3, normal strength and tone. Normal symmetric reflexes. Normal coordination and gait.  Foot exam:  Nails are well trimmed,  No callouses,  Sensation intact to microfilament   Assessment and Plan:   Type II or unspecified type diabetes mellitus without mention of complication, uncontrolled Advised to check a 3 am sugar at least twice before increasing the evening insulin dose.   Lab Results  Component Value Date   HGBA1C 8.3* 10/12/2013   Lab Results  Component Value Date   MICROALBUR 0.9 10/12/2013     Routine general medical examination at a health care facility Annual comprehensive exam was done including breast, excluding pelvic and PAP smear. All screenings have been addressed .   Perimenopause  Rectal exam and FOBT were done at last visit and  And normal.   Reassurance provided that her amenorrhea and irregularity were common occurrences .     Updated Medication List Outpatient Encounter Prescriptions as of 01/03/2014  Medication Sig  . amLODipine (NORVASC) 10 MG tablet TAKE ONE TABLET BY MOUTH DAILY  . atenolol (TENORMIN) 50 MG tablet TAKE ONE TABLET BY MOUTH DAILY  . fenofibrate 160 MG tablet Take 1 tablet (160 mg total) by mouth daily.  . fish oil-omega-3 fatty acids 1000 MG capsule Take 1 g by  mouth 2 (two) times daily.  [name] glucose blood (ACCU-CHEK AVIVA) test strip Check sugars twice daily  . insulin lispro (HUMALOG) 100 UNIT/ML injection Inject 10 Units into the skin 3 (three) times daily before meals. Increase as directed by physician  . insulin NPH (HUMULIN N) 100 UNIT/ML injection Inject 70 Units into the skin 2 (two) times daily at 8 am and 10 pm.  . Insulin Syringes, Disposable, U-100 1 ML MISC Use as directed  . losartan (COZAAR) 100 MG tablet TAKE ONE TABLET BY MOUTH DAILY  . Multiple Vitamins-Minerals (MULTIVITAMIN WITH MINERALS) tablet Take 1 tablet by mouth daily.  . Probiotic Product (PROBIOTIC MATURE ADULT) CAPS Take 1 capsule by mouth daily.  . Vitamin D, Ergocalciferol, (DRISDOL) 50000 UNITS CAPS Take 50,000 Units by mouth every 7 (seven) days.

## 2014-01-04 ENCOUNTER — Telehealth: Payer: Self-pay | Admitting: *Deleted

## 2014-01-04 NOTE — Telephone Encounter (Signed)
Pt is coming in Friday. What labs and dx?

## 2014-01-05 ENCOUNTER — Other Ambulatory Visit: Payer: Self-pay | Admitting: Internal Medicine

## 2014-01-05 NOTE — Assessment & Plan Note (Addendum)
Advised to check a 3 am sugar at least twice before increasing the evening insulin dose.   Lab Results  Component Value Date   HGBA1C 8.3* 10/12/2013   Lab Results  Component Value Date   MICROALBUR 0.9 10/12/2013

## 2014-01-06 NOTE — Assessment & Plan Note (Signed)
Rectal exam and FOBT were done at last visit and  And normal.   Reassurance provided that her amenorrhea and irregularity were common occurrences .

## 2014-01-06 NOTE — Assessment & Plan Note (Signed)
Annual comprehensive exam was done including breast, excluding pelvic and PAP smear. All screenings have been addressed .  

## 2014-01-11 ENCOUNTER — Other Ambulatory Visit (INDEPENDENT_AMBULATORY_CARE_PROVIDER_SITE_OTHER): Payer: BC Managed Care – PPO

## 2014-01-11 ENCOUNTER — Telehealth: Payer: Self-pay | Admitting: *Deleted

## 2014-01-11 DIAGNOSIS — E1159 Type 2 diabetes mellitus with other circulatory complications: Secondary | ICD-10-CM

## 2014-01-11 DIAGNOSIS — E559 Vitamin D deficiency, unspecified: Secondary | ICD-10-CM

## 2014-01-11 LAB — COMPREHENSIVE METABOLIC PANEL WITH GFR
ALT: 19 U/L (ref 0–35)
AST: 20 U/L (ref 0–37)
Albumin: 3.5 g/dL (ref 3.5–5.2)
Alkaline Phosphatase: 49 U/L (ref 39–117)
BUN: 17 mg/dL (ref 6–23)
CO2: 25 meq/L (ref 19–32)
Calcium: 9 mg/dL (ref 8.4–10.5)
Chloride: 103 meq/L (ref 96–112)
Creatinine, Ser: 1 mg/dL (ref 0.4–1.2)
GFR: 63.86 mL/min
Glucose, Bld: 117 mg/dL — ABNORMAL HIGH (ref 70–99)
Potassium: 4.5 meq/L (ref 3.5–5.1)
Sodium: 136 meq/L (ref 135–145)
Total Bilirubin: 0.9 mg/dL (ref 0.2–1.2)
Total Protein: 6.5 g/dL (ref 6.0–8.3)

## 2014-01-11 LAB — LIPID PANEL
CHOL/HDL RATIO: 5
Cholesterol: 247 mg/dL — ABNORMAL HIGH (ref 0–200)
HDL: 45 mg/dL (ref >39.00)
LDL CALC: 162 mg/dL — AB (ref 0–99)
TRIGLYCERIDES: 200 mg/dL — AB (ref 0.0–149.0)
VLDL: 40 mg/dL (ref 0.0–40.0)

## 2014-01-11 LAB — HEMOGLOBIN A1C: Hgb A1c MFr Bld: 7.2 % — ABNORMAL HIGH (ref 4.6–6.5)

## 2014-01-11 NOTE — Telephone Encounter (Signed)
What labs and dx?  

## 2014-01-12 LAB — VITAMIN D 25 HYDROXY (VIT D DEFICIENCY, FRACTURES): Vit D, 25-Hydroxy: 34 ng/mL (ref 30–89)

## 2014-01-16 ENCOUNTER — Encounter: Payer: Self-pay | Admitting: *Deleted

## 2014-03-02 ENCOUNTER — Other Ambulatory Visit: Payer: Self-pay | Admitting: Internal Medicine

## 2014-03-13 ENCOUNTER — Other Ambulatory Visit: Payer: Self-pay | Admitting: Internal Medicine

## 2014-03-15 ENCOUNTER — Other Ambulatory Visit: Payer: Self-pay | Admitting: Internal Medicine

## 2014-03-27 ENCOUNTER — Other Ambulatory Visit: Payer: Self-pay | Admitting: Internal Medicine

## 2014-03-28 ENCOUNTER — Encounter: Payer: Self-pay | Admitting: Internal Medicine

## 2014-03-28 ENCOUNTER — Ambulatory Visit (INDEPENDENT_AMBULATORY_CARE_PROVIDER_SITE_OTHER): Payer: BC Managed Care – PPO | Admitting: Internal Medicine

## 2014-03-28 VITALS — BP 120/70 | HR 65 | Temp 97.6°F | Resp 16 | Ht 67.5 in | Wt 209.0 lb

## 2014-03-28 DIAGNOSIS — N183 Chronic kidney disease, stage 3 unspecified: Secondary | ICD-10-CM

## 2014-03-28 DIAGNOSIS — E1129 Type 2 diabetes mellitus with other diabetic kidney complication: Secondary | ICD-10-CM

## 2014-03-28 DIAGNOSIS — E1122 Type 2 diabetes mellitus with diabetic chronic kidney disease: Secondary | ICD-10-CM

## 2014-03-28 DIAGNOSIS — I10 Essential (primary) hypertension: Secondary | ICD-10-CM

## 2014-03-28 DIAGNOSIS — IMO0001 Reserved for inherently not codable concepts without codable children: Secondary | ICD-10-CM

## 2014-03-28 DIAGNOSIS — E1165 Type 2 diabetes mellitus with hyperglycemia: Secondary | ICD-10-CM

## 2014-03-28 DIAGNOSIS — E669 Obesity, unspecified: Secondary | ICD-10-CM

## 2014-03-28 LAB — HM DIABETES FOOT EXAM: HM DIABETIC FOOT EXAM: NORMAL

## 2014-03-28 LAB — COMPREHENSIVE METABOLIC PANEL
ALK PHOS: 50 U/L (ref 39–117)
ALT: 28 U/L (ref 0–35)
AST: 20 U/L (ref 0–37)
Albumin: 3.8 g/dL (ref 3.5–5.2)
BUN: 19 mg/dL (ref 6–23)
CO2: 24 mEq/L (ref 19–32)
Calcium: 9.4 mg/dL (ref 8.4–10.5)
Chloride: 105 mEq/L (ref 96–112)
Creatinine, Ser: 1 mg/dL (ref 0.4–1.2)
GFR: 58.18 mL/min — ABNORMAL LOW (ref >60.00)
Glucose, Bld: 198 mg/dL — ABNORMAL HIGH (ref 70–99)
Potassium: 4.2 mEq/L (ref 3.5–5.1)
SODIUM: 136 meq/L (ref 135–145)
TOTAL PROTEIN: 7.3 g/dL (ref 6.0–8.3)
Total Bilirubin: 0.6 mg/dL (ref 0.2–1.2)

## 2014-03-28 NOTE — Progress Notes (Signed)
Pre-visit discussion using our clinic review tool. No additional management support is needed unless otherwise documented below in the visit note.  

## 2014-03-28 NOTE — Progress Notes (Signed)
Patient ID: Pamela Harris, female   DOB: [birth year], 58 y.o.   MRN: [id number]  Patient Active Problem List   Diagnosis Date Noted  . CKD stage 3 due to type 2 diabetes mellitus 03/31/2014  . Perimenopause 10/14/2013  . Statin intolerance 10/14/2013  . Routine general medical examination at a health care facility 12/29/2012  . Obesity (BMI 30-39.9) 03/26/2012  . Type II or unspecified type diabetes mellitus with renal manifestations, uncontrolled 03/14/2007  . Hypercholesterolemia with hypertriglyceridemia 03/14/2007  . HYPERTENSION 03/14/2007    Subjective:  CC:   Chief Complaint  Patient presents with  . Follow-up  . Diabetes    HPI:   Pamela Harris is a 58 y.o. female who presents for Follow up on DM,  CKd,    Seeing Nephrology every 3 months,  Since December.  Decreased in function noted,  With decrease progressively noted.  Has been drinkin 6 to 8 glasses of water in addition to meals but was told that her declining function was due to dehydration.Pamela Harris has been fasting and withholding water as well prior to her lab draws at the nephrology office.   Blood sugars have been elevated at times.  Due to dietary indulgences in potatoes and watermelon Diet reviewed in detail    Occasional early am wakening at 3 am.  Not occurring more than 2-3 times per week.  Dicussed melatonin  Recent episode  of great toe pain on lateral side ,  Left foot,  res lved sponatneously,  Foot exam is normal.    Past Medical History  Diagnosis Date  . Hyperlipidemia   . Hypertension   . Diabetes mellitus     History reviewed. No pertinent past surgical history.     The following portions of the patient's history were reviewed and updated as appropriate: Allergies, current medications, and problem list.    Review of Systems:   Patient denies headache, fevers, malaise, unintentional weight loss, skin rash, eye pain, sinus congestion and sinus pain, sore throat, dysphagia,   hemoptysis , cough, dyspnea, wheezing, chest pain, palpitations, orthopnea, edema, abdominal pain, nausea, melena, diarrhea, constipation, flank pain, dysuria, hematuria, urinary  Frequency, nocturia, numbness, tingling, seizures,  Focal weakness, Loss of consciousness,  Tremor, insomnia, depression, anxiety, and suicidal ideation.     History   Social History  . Marital Status: Married    Spouse Name: N/A    Number of Children: N/A  . Years of Education: N/A   Occupational History  . Not on file.   Social History Main Topics  . Smoking status: Never Smoker   . Smokeless tobacco: Never Used  . Alcohol Use: No  . Drug Use: No  . Sexual Activity: Not on file   Other Topics Concern  . Not on file   Social History Narrative  . No narrative on file    Objective:  Filed Vitals:   03/28/14 0852  BP: 120/70  Pulse: 65  Temp: 97.6 F (36.4 C)  Resp: 16     General appearance: alert, cooperative and appears stated age Ears: normal TM's and external ear canals both ears Throat: lips, mucosa, and tongue normal; teeth and gums normal Neck: no adenopathy, no carotid bruit, supple, symmetrical, trachea midline and thyroid not enlarged, symmetric, no tenderness/mass/nodules Back: symmetric, no curvature. ROM normal. No CVA tenderness. Lungs: clear to auscultation bilaterally Heart: regular rate and rhythm, S1, S2 normal, no murmur, click, rub or gallop Abdomen: soft, non-tender; bowel sounds normal; no masses,  no organomegaly Pulses: 2+ and symmetric Skin: Skin color, texture, turgor normal. No rashes or lesions Lymph nodes: Cervical, supraclavicular, and axillary nodes normal.  Assessment and Plan:  HYPERTENSION Well controlled on current regimen. Renal function stable, no changes today.  Lab Results  Component Value Date   CREATININE 1.0 03/28/2014   Lab Results  Component Value Date   MICROALBUR 0.9 10/12/2013      Type II or unspecified type diabetes mellitus  with renal manifestations, uncontrolled Reminder for annual diabetic eye exam given..  Foot exam done. Meds reviewed and she is on a baby aspirin daily., a statin and an ACE inhibitor.  Urine tested for protein.   Lab Results  Component Value Date   HGBA1C 7.2* 01/11/2014   Lab Results  Component Value Date   MICROALBUR 0.9 10/12/2013     CKD stage 3 due to type 2 diabetes mellitus Under surveillance by Nephrology ,  Repeat BMEt to be done to assess nonfasting GFR  Obesity (BMI 30-39.9) I have addressed  BMI and recommended wt loss of 10% of body weight over the next 6 months using a low glycemic index diet and regular exercise a minimum of 5 days per week.        Updated Medication List Outpatient Encounter Prescriptions as of 03/28/2014  Medication Sig  . amLODipine (NORVASC) 10 MG tablet TAKE ONE TABLET BY MOUTH DAILY  . atenolol (TENORMIN) 50 MG tablet TAKE ONE TABLET BY MOUTH DAILY  . fenofibrate 160 MG tablet TAKE ONE TABLET BY MOUTH DAILY  . fish oil-omega-3 fatty acids 1000 MG capsule Take 1 g by mouth 2 (two) times daily.  [name] glucose blood (ACCU-CHEK AVIVA) test strip Check sugars twice daily  . HEALTHY ACCENTS UNIFINE PENTIP 31G X 8 MM MISC USE AS DIRECTED  . HUMALOG KWIKPEN 100 UNIT/ML KiwkPen INJECT 10 UNITS INTO THE SKIN 2 TIMES A DAY BEFORE A MEAL & INCREASEAS NEEDED FOR UNCONTROLLED DIABETES  . losartan (COZAAR) 100 MG tablet TAKE ONE TABLET BY MOUTH DAILY  . Multiple Vitamins-Minerals (MULTIVITAMIN WITH MINERALS) tablet Take 1 tablet by mouth daily.  [name] NOVOLIN N 100 UNIT/ML injection INJECT 70 UNITS INTO THE SKIN 2 (TWO) TIMES DAILY AT 8 AM AND 10 PM.  . Probiotic Product (PROBIOTIC MATURE ADULT) CAPS Take 1 capsule by mouth daily.  [name] ULTICARE INSULIN SYRINGE 31G X 5/16" 1 ML MISC USE AS DIRECTED  . Vitamin D, Ergocalciferol, (DRISDOL) 50000 UNITS CAPS Take 50,000 Units by mouth every 7 (seven) days.  . [DISCONTINUED] insulin lispro (HUMALOG) 100 UNIT/ML injection  Inject 10 Units into the skin 3 (three) times daily before meals. Increase as directed by physician     Orders Placed This Encounter  Procedures  . Comprehensive metabolic panel  . Hemoglobin A1c  . Comprehensive metabolic panel  . Lipid panel  . Microalbumin / creatinine urine ratio  . HM DIABETES FOOT EXAM    Return in about 3 months (around 06/28/2014) for follow up diabetes.

## 2014-03-28 NOTE — Patient Instructions (Signed)
Your A1c is not due until after 8/22 so we will wait until November to repeat it , prior to your next visit

## 2014-03-31 ENCOUNTER — Encounter: Payer: Self-pay | Admitting: Internal Medicine

## 2014-03-31 DIAGNOSIS — N183 Chronic kidney disease, stage 3 unspecified: Secondary | ICD-10-CM | POA: Insufficient documentation

## 2014-03-31 DIAGNOSIS — E1122 Type 2 diabetes mellitus with diabetic chronic kidney disease: Secondary | ICD-10-CM | POA: Insufficient documentation

## 2014-03-31 NOTE — Assessment & Plan Note (Addendum)
Reminder for annual diabetic eye exam given..  Foot exam done. Meds reviewed and she is on a baby aspirin daily., a statin and an ACE inhibitor.  Urine tested for protein.   Lab Results  Component Value Date   HGBA1C 7.2* 01/11/2014   Lab Results  Component Value Date   MICROALBUR 0.9 10/12/2013

## 2014-03-31 NOTE — Assessment & Plan Note (Signed)
Under surveillance by Nephrology ,  Repeat BMEt to be done to assess nonfasting GFR

## 2014-03-31 NOTE — Assessment & Plan Note (Signed)
I have addressed  BMI and recommended wt loss of 10% of body weight over the next 6 months using a low glycemic index diet and regular exercise a minimum of 5 days per week.   

## 2014-03-31 NOTE — Assessment & Plan Note (Signed)
Well controlled on current regimen. Renal function stable, no changes today.  Lab Results  Component Value Date   CREATININE 1.0 03/28/2014   Lab Results  Component Value Date   MICROALBUR 0.9 10/12/2013

## 2014-04-02 ENCOUNTER — Encounter: Payer: Self-pay | Admitting: *Deleted

## 2014-04-08 ENCOUNTER — Other Ambulatory Visit: Payer: Self-pay | Admitting: Internal Medicine

## 2014-04-12 ENCOUNTER — Ambulatory Visit: Payer: BC Managed Care – PPO | Admitting: Internal Medicine

## 2014-05-17 ENCOUNTER — Other Ambulatory Visit: Payer: Self-pay | Admitting: *Deleted

## 2014-05-17 MED ORDER — GLUCOSE BLOOD VI STRP: ORAL_STRIP | Status: DC

## 2014-05-21 ENCOUNTER — Other Ambulatory Visit: Payer: Self-pay | Admitting: Internal Medicine

## 2014-07-02 ENCOUNTER — Other Ambulatory Visit (INDEPENDENT_AMBULATORY_CARE_PROVIDER_SITE_OTHER): Payer: BC Managed Care – PPO

## 2014-07-02 DIAGNOSIS — E1129 Type 2 diabetes mellitus with other diabetic kidney complication: Secondary | ICD-10-CM

## 2014-07-02 LAB — MICROALBUMIN / CREATININE URINE RATIO
Creatinine,U: 147.3 mg/dL
MICROALB/CREAT RATIO: 0.5 mg/g (ref 0.0–30.0)
Microalb, Ur: 0.8 mg/dL (ref 0.0–1.9)

## 2014-07-02 LAB — LIPID PANEL
Cholesterol: 232 mg/dL — ABNORMAL HIGH (ref 0–200)
HDL: 33.1 mg/dL — ABNORMAL LOW (ref >39.00)
LDL Cholesterol: 159 mg/dL — ABNORMAL HIGH (ref 0–99)
NONHDL: 198.9
Total CHOL/HDL Ratio: 7
Triglycerides: 200 mg/dL — ABNORMAL HIGH (ref 0.0–149.0)
VLDL: 40 mg/dL (ref 0.0–40.0)

## 2014-07-02 LAB — COMPREHENSIVE METABOLIC PANEL
ALT: 19 U/L (ref 0–35)
AST: 19 U/L (ref 0–37)
Albumin: 3.3 g/dL — ABNORMAL LOW (ref 3.5–5.2)
Alkaline Phosphatase: 50 U/L (ref 39–117)
BUN: 20 mg/dL (ref 6–23)
CALCIUM: 9.2 mg/dL (ref 8.4–10.5)
CHLORIDE: 107 meq/L (ref 96–112)
CO2: 25 mEq/L (ref 19–32)
CREATININE: 1.1 mg/dL (ref 0.4–1.2)
GFR: 54.48 mL/min — ABNORMAL LOW (ref >60.00)
Glucose, Bld: 180 mg/dL — ABNORMAL HIGH (ref 70–99)
POTASSIUM: 4.2 meq/L (ref 3.5–5.1)
Sodium: 139 mEq/L (ref 135–145)
Total Bilirubin: 0.6 mg/dL (ref 0.2–1.2)
Total Protein: 7 g/dL (ref 6.0–8.3)

## 2014-07-02 LAB — HEMOGLOBIN A1C: HEMOGLOBIN A1C: 8.5 % — AB (ref 4.6–6.5)

## 2014-07-04 ENCOUNTER — Ambulatory Visit (INDEPENDENT_AMBULATORY_CARE_PROVIDER_SITE_OTHER): Payer: BC Managed Care – PPO | Admitting: Internal Medicine

## 2014-07-04 ENCOUNTER — Encounter: Payer: Self-pay | Admitting: Internal Medicine

## 2014-07-04 VITALS — BP 112/60 | HR 68 | Temp 98.4°F | Resp 16 | Ht 67.5 in | Wt 208.5 lb

## 2014-07-04 DIAGNOSIS — Z23 Encounter for immunization: Secondary | ICD-10-CM

## 2014-07-04 DIAGNOSIS — E1129 Type 2 diabetes mellitus with other diabetic kidney complication: Secondary | ICD-10-CM

## 2014-07-04 DIAGNOSIS — E782 Mixed hyperlipidemia: Secondary | ICD-10-CM

## 2014-07-04 DIAGNOSIS — E1165 Type 2 diabetes mellitus with hyperglycemia: Secondary | ICD-10-CM

## 2014-07-04 DIAGNOSIS — IMO0002 Reserved for concepts with insufficient information to code with codable children: Secondary | ICD-10-CM

## 2014-07-04 DIAGNOSIS — D239 Other benign neoplasm of skin, unspecified: Secondary | ICD-10-CM

## 2014-07-04 MED ORDER — ZOSTER VACCINE LIVE 19400 UNT/0.65ML ~~LOC~~ SOLR: 0.6500 mL | Freq: Once | SUBCUTANEOUS | Status: DC

## 2014-07-04 NOTE — Patient Instructions (Addendum)
Your diabetes is under poor control on current regimen,   Try taking the NPH at 9 am and 9 Pm roughly .   Increase  the NPH  to 70 units at night if morning fasting sugars  are still > 150  After the timing change   Referral to Dr Howell Rucks for help in managing diabetes

## 2014-07-04 NOTE — Progress Notes (Signed)
Pre-visit discussion using our clinic review tool. No additional management support is needed unless otherwise documented below in the visit note.  

## 2014-07-04 NOTE — Progress Notes (Signed)
Patient ID: Pamela Harris, female   DOB: 01/26/1956, 58 y.o.   MRN: [id number]  Patient Active Problem List   Diagnosis Date Noted  . Dysplastic nevus 07/04/2014  . CKD stage 3 due to type 2 diabetes mellitus 03/31/2014  . Perimenopause 10/14/2013  . Statin intolerance 10/14/2013  . Routine general medical examination at a health care facility 12/29/2012  . Obesity (BMI 30-39.9) 03/26/2012  . DM (diabetes mellitus), type 2, uncontrolled, with renal complications 67/34/1937  . Hypercholesterolemia with hypertriglyceridemia 03/14/2007  . HYPERTENSION 03/14/2007    Subjective:  CC:   Chief Complaint  Patient presents with  . Follow-up    Patient has Vit D 50,000 unit one tablet left ask if she needs to continue last Vit D 34.  . Diabetes    HPI:   Pamela Harris is a 58 y.o. female who presents for   Follow up on DM. Type 2, insulin requiring with progressive loss of control.  She has not brought a log of her blood sugars with her.  She has been taking  NPH twice daily at breakfast and dinner,  And humalog short acting insulin at meals but continues to have labile blood sugars . She has not had any lows.  Does not ajhust her NPH but uses a sliding scale for the short acting insulin    Past Medical History  Diagnosis Date  . Hyperlipidemia   . Hypertension   . Diabetes mellitus     History reviewed. No pertinent past surgical history.     The following portions of the patient's history were reviewed and updated as appropriate: Allergies, current medications, and problem list.    Review of Systems:   Patient denies headache, fevers, malaise, unintentional weight loss, skin rash, eye pain, sinus congestion and sinus pain, sore throat, dysphagia,  hemoptysis , cough, dyspnea, wheezing, chest pain, palpitations, orthopnea, edema, abdominal pain, nausea, melena, diarrhea, constipation, flank pain, dysuria, hematuria, urinary  Frequency, nocturia, numbness, tingling,  seizures,  Focal weakness, Loss of consciousness,  Tremor, insomnia, depression, anxiety, and suicidal ideation.     History   Social History  . Marital Status: Married    Spouse Name: N/A    Number of Children: N/A  . Years of Education: N/A   Occupational History  . Not on file.   Social History Main Topics  . Smoking status: Never Smoker   . Smokeless tobacco: Never Used  . Alcohol Use: No  . Drug Use: No  . Sexual Activity: Not on file   Other Topics Concern  . Not on file   Social History Narrative    Objective:  Filed Vitals:   07/04/14 0918  BP: 112/60  Pulse: 68  Temp: 98.4 F (36.9 C)  Resp: 16     General appearance: alert, cooperative and appears stated age Ears: normal TM's and external ear canals both ears Throat: lips, mucosa, and tongue normal; teeth and gums normal Neck: no adenopathy, no carotid bruit, supple, symmetrical, trachea midline and thyroid not enlarged, symmetric, no tenderness/mass/nodules Back: symmetric, no curvature. ROM normal. No CVA tenderness. Lungs: clear to auscultation bilaterally Heart: regular rate and rhythm, S1, S2 normal, no murmur, click, rub or gallop Abdomen: soft, non-tender; bowel sounds normal; no masses,  no organomegaly Pulses: 2+ and symmetric Skin: Skin color, texture, turgor normal. No rashes or lesions Lymph nodes: Cervical, supraclavicular, and axillary nodes normal.  Assessment and Plan:  DM (diabetes mellitus), type 2, uncontrolled, with renal complications Loss of  control over the past 3 months noted.Patientr advised to taek NPH at bedtime,  not at 6 PM which may improve the fasting sugars.  Advised to increase evening dose to 70 units if fastings are not < 150.  Reminder for annual diabetic eye exam given..  Foot exam done. Meds reviewed and she is on a baby aspirin daily., a statin and an ACE inhibitor.  Urine tested for protein. Referral to Dr Howell Rucks for assistance in improving control.   Lab Results   Component Value Date   HGBA1C 8.5* 07/02/2014   Lab Results  Component Value Date   MICROALBUR 0.8 07/02/2014        Updated Medication List Outpatient Encounter Prescriptions as of 07/04/2014  Medication Sig  . amLODipine (NORVASC) 10 MG tablet TAKE ONE TABLET BY MOUTH DAILY  . atenolol (TENORMIN) 50 MG tablet TAKE ONE TABLET BY MOUTH DAILY  . fenofibrate 160 MG tablet TAKE ONE TABLET BY MOUTH DAILY  . fish oil-omega-3 fatty acids 1000 MG capsule Take 1 g by mouth 2 (two) times daily.  [name] glucose blood (ACCU-CHEK AVIVA) test strip Check sugars twice daily  . HEALTHY ACCENTS UNIFINE PENTIP 31G X 8 MM MISC USE AS DIRECTED  . HUMALOG KWIKPEN 100 UNIT/ML KiwkPen INJECT 10 UNITS INTO THE SKIN 2 TIMES A DAY BEFORE A MEAL & INCREASEAS NEEDED FOR UNCONTROLLED DIABETES  . losartan (COZAAR) 100 MG tablet TAKE ONE TABLET BY MOUTH DAILY  . Multiple Vitamins-Minerals (MULTIVITAMIN WITH MINERALS) tablet Take 1 tablet by mouth daily.  [name] NOVOLIN N 100 UNIT/ML injection INJECT 70 UNITS INTO THE SKIN 2 (TWO) TIMES DAILY AT 8 AM AND 10 PM.  . Probiotic Product (PROBIOTIC MATURE ADULT) CAPS Take 1 capsule by mouth daily.  [name] ULTICARE INSULIN SYRINGE 31G X 5/16" 1 ML MISC USE AS DIRECTED  . Vitamin D, Ergocalciferol, (DRISDOL) 50000 UNITS CAPS Take 50,000 Units by mouth every 7 (seven) days.  [name] zoster vaccine live, PF, (ZOSTAVAX) 75051 UNT/0.65ML injection Inject 19,400 Units into the skin once.     Orders Placed This Encounter  Procedures  . Flu Vaccine QUAD 36+ mos IM  . HM COLONOSCOPY    No Follow-up on file.

## 2014-07-05 NOTE — Addendum Note (Signed)
Addended by: Crecencio Mc on: 07/05/2014 12:29 PM   Modules accepted: Orders

## 2014-07-06 ENCOUNTER — Encounter: Payer: Self-pay | Admitting: Internal Medicine

## 2014-07-06 NOTE — Assessment & Plan Note (Signed)
Triglycerides have improved with use of fenofibrate   She refuses to take statins due to mistrust of safety profile.  Using the Framingham risk calculator,  her 10 year risk of coronary artery disease is 24%.  Lab Results  Component Value Date   CHOL 232* 07/02/2014   HDL 33.10* 07/02/2014   LDLCALC 159* 07/02/2014   LDLDIRECT 178.2 10/12/2013   TRIG 200.0* 07/02/2014   CHOLHDL 7 07/02/2014

## 2014-07-06 NOTE — Assessment & Plan Note (Addendum)
Loss of control over the past 3 months noted.Patientr advised to taek NPH at bedtime,  not at 6 PM which may improve the fasting sugars.  Advised to increase evening dose to 70 units if fastings are not < 150.  Reminder for annual diabetic eye exam given..  Foot exam done. Meds reviewed and she is on a baby aspirin daily., a statin and an ACE inhibitor.  Urine tested for protein. Referral to Dr Howell Rucks for assistance in improving control.   Lab Results  Component Value Date   HGBA1C 8.5* 07/02/2014   Lab Results  Component Value Date   MICROALBUR 0.8 07/02/2014

## 2014-07-16 ENCOUNTER — Encounter: Payer: Self-pay | Admitting: Endocrinology

## 2014-07-16 ENCOUNTER — Ambulatory Visit (INDEPENDENT_AMBULATORY_CARE_PROVIDER_SITE_OTHER): Payer: BC Managed Care – PPO | Admitting: Endocrinology

## 2014-07-16 VITALS — BP 122/74 | HR 80 | Wt 209.2 lb

## 2014-07-16 DIAGNOSIS — IMO0002 Reserved for concepts with insufficient information to code with codable children: Secondary | ICD-10-CM

## 2014-07-16 DIAGNOSIS — N183 Chronic kidney disease, stage 3 (moderate): Secondary | ICD-10-CM

## 2014-07-16 DIAGNOSIS — E1129 Type 2 diabetes mellitus with other diabetic kidney complication: Secondary | ICD-10-CM

## 2014-07-16 DIAGNOSIS — E1165 Type 2 diabetes mellitus with hyperglycemia: Secondary | ICD-10-CM

## 2014-07-16 DIAGNOSIS — I10 Essential (primary) hypertension: Secondary | ICD-10-CM

## 2014-07-16 DIAGNOSIS — E782 Mixed hyperlipidemia: Secondary | ICD-10-CM

## 2014-07-16 DIAGNOSIS — E1122 Type 2 diabetes mellitus with diabetic chronic kidney disease: Secondary | ICD-10-CM

## 2014-07-16 LAB — HM DIABETES FOOT EXAM: HM Diabetic Foot Exam: NORMAL

## 2014-07-16 MED ORDER — "INSULIN SYRINGE 31G X 5/16"" 0.3 ML MISC": Status: DC

## 2014-07-16 MED ORDER — INSULIN REGULAR HUMAN (CONC) 500 UNIT/ML ~~LOC~~ SOLN
SUBCUTANEOUS | Status: DC
Start: 2014-07-16 — End: 2014-08-20

## 2014-07-16 NOTE — Assessment & Plan Note (Addendum)
Recent A1c and sugars are not at goal. Discussed improving her sugar control to around A1c of 7.5%.   Discussed changes to diet and exercise regimen, and she is going to start making these as she can.  Discussed importance of checking sugars 3- 4 x daily.   Discussed various treatment options including switch to basal/bolus insulin, add SGLT2 versus GLP-1, going back to premixed insulin or switching over to concentrated insulin since she is requiring about 220 units of insulin daily.   She has elected to try U-500 insulin.  Have asked her to stop NPH and Humalog insulin from today and start U-500 insulin at 11 units three times daily from tomorrow. Insulin dosing and U-500 dosing explained in detail to the patient.    She will report back if she starts to have problems with her sugars.   RTC 3 weeks.

## 2014-07-16 NOTE — Assessment & Plan Note (Signed)
Lipids are not controlled. Patient previously refused statin therapy and recalls not tolerating Atorvastatin in the past.  TGs are improving on fenofibrate. Followed by PCP.

## 2014-07-16 NOTE — Assessment & Plan Note (Signed)
BP at target today on current regimen. Last urine MA negative November 2015.          

## 2014-07-16 NOTE — Assessment & Plan Note (Signed)
Recent GFR stable and c/w Stage  3 CKD     

## 2014-07-16 NOTE — Progress Notes (Signed)
Pre visit review using our clinic review tool, if applicable. No additional management support is needed unless otherwise documented below in the visit note. 

## 2014-07-16 NOTE — Progress Notes (Signed)
Reason for visit-  Pamela Harris is a 58 y.o.-year-old female, referred by her PCP,  Crecencio Mc, MD for management of Type 2 diabetes, uncontrolled, with complications ( stage 3 CKD, retinopathy).   HPI- Patient has been diagnosed with diabetes in 1996. Recalls being initially on lifestyle modifications.  Tried  Metformin, Actos/Avandia, Byetta prior. she has  been on insulin for majority of the time after the DKA episode in the 1990s.   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 * also tried on other insulins in the past, 75/25 , 70/30 and now most recently is on the combination below.  *Recalls Lantus was not covered under her plan several years ago.   Pt is currently on a regimen of: - Novolin NPH insulin  80 units  At 9 am and 90 units  At 9 pm ( self increased dose about 2 weeks ago from 26 BID-doesn't feel that it is helping much -Humalog SS qac 20 units BF, 15 units with lunch and 20 units with supper. Occasionally skips lunch time and that shot, eating schedulevaries by week to week  *Sometimes takes insulin after eating BF as well as BF timings are quite variable.  *Now taking  Pm shot at bedtime * Uses more of the humalog if sugars are high based on her own sliding scale *moves injection sites around    Last hemoglobin A1c was: Lab Results  Component Value Date   HGBA1C 8.5* 07/02/2014   HGBA1C 7.2* 01/11/2014   HGBA1C 8.3* 10/12/2013     Pt checks her sugars 2 x a day -recently. Uses accuchek compact plus glucometer. By meter download/meter review they are:  PREMEAL Breakfast Lunch Dinner Bedtime Overall  Glucose range: 133-235 122-242 140-229 162   Mean/median:        POST-MEAL PC Breakfast PC Lunch PC Dinner  Glucose range:     Mean/median:       Hypoglycemia-  One recent  lowish sugar in the 70s several weeks ago after skipping meals.   Dietary habits- eats three times daily. Tries to limit carbs, sweetened beverages, sodas, desserts.  Exercise- not too good. Has equipment at home-TD, bike and elliptical.   Weight -  Wt Readings from Last 3 Encounters:  07/16/14 209 lb 4 oz (94.915 kg)  07/04/14 208 lb 8 oz (94.575 kg)  03/28/14 209 lb (94.802 kg)    Diabetes Complications-  Nephropathy- Yes , stage 3  CKD, last BUN/creatinine- GFR 54 Lab Results  Component Value Date   BUN 20 07/02/2014   CREATININE 1.1 07/02/2014   Lab Results  Component Value Date   GFR 54.48* 07/02/2014   MICRALBCREAT 0.5 07/02/2014     Retinopathy- Yes, Last DEE was 2015, in 2008 hemorrhages right eye- had laser surgery, told about retinopathy then- now goes to [facility] 2x yearly Retinal surgery in right eye, cataract surgery right eye, developing cataracts left eye   Neuropathy- no numbness and tingling in her feet. No known neuropathy. Starting to notice occasional burning of feet. Reports occasional numbness in left great toe since her ankle surgery.  Associated history - No CAD . No prior stroke. No hypothyroidism. her last TSH was  Lab Results  Component Value Date   TSH 2.60 10/12/2013    Hyperlipidemia-  her last set of lipids  were- Currently on fish oil and fenofibrate. Tolerating well. Allergic to Atorvastatin due to memory problems and muscle weakness.   Lab Results  Component Value Date   CHOL 232* 07/02/2014   HDL 33.10* 07/02/2014   LDLCALC 159* 07/02/2014   LDLDIRECT 178.2 10/12/2013   TRIG 200.0* 07/02/2014   CHOLHDL 7 07/02/2014    Blood Pressure/HTN- Patient's blood pressure is well controlled today on current regimen that includes ARB ( Cozaar).  Pt has FH of DM in parents.  I have reviewed the patient's past medical history, family and social history, surgical history, medications and allergies.  Past Medical History  Diagnosis  Date  . Hyperlipidemia   . Hypertension   . Diabetes mellitus    Past Surgical History  Procedure Laterality Date  . Eye surgery    . Mouth surgery    . Closed reduction ankle dislocation Left    Family History  Problem Relation Age of Onset  . Diabetes Mother   . Heart disease Mother   . Stroke Mother   . Hypertension Mother   . Osteoporosis Mother   . Diabetes Father   . Coronary artery disease Father   . Heart disease Father   . Cancer Maternal Aunt     Ovarian  . Heart disease Maternal Grandfather   . Heart disease Paternal Grandfather     MI   History   Social History  . Marital Status: Married    Spouse Name: N/A    Number of Children: N/A  . Years of Education: N/A   Occupational History  . Not on file.   Social History Main Topics  . Smoking status: Never Smoker   . Smokeless tobacco: Never Used  . Alcohol Use: No  . Drug Use: No  . Sexual Activity: Not on file   Other Topics Concern  . Not on file   Social History Narrative   Current Outpatient Prescriptions on File Prior to Visit  Medication Sig Dispense Refill  . amLODipine (NORVASC) 10 MG tablet TAKE ONE TABLET BY MOUTH DAILY 90 tablet 1  . atenolol (TENORMIN) 50 MG tablet TAKE ONE TABLET BY MOUTH DAILY 90 tablet 1  . fenofibrate 160 MG tablet TAKE ONE TABLET BY MOUTH DAILY 90 tablet 1  . fish oil-omega-3 fatty acids 1000 MG capsule Take 1 g by mouth 2 (two) times daily.    [name] glucose blood (ACCU-CHEK AVIVA) test strip Check sugars twice daily 100 each 11  . HEALTHY ACCENTS UNIFINE PENTIP 31G X 8 MM MISC USE AS DIRECTED 100 each PRN  . losartan (COZAAR) 100 MG tablet TAKE ONE TABLET BY MOUTH DAILY 90 tablet 1  . Multiple Vitamins-Minerals (MULTIVITAMIN WITH MINERALS) tablet Take 1 tablet by mouth daily.    . Probiotic Product (PROBIOTIC MATURE ADULT) CAPS Take 1 capsule by mouth daily.    . Vitamin D, Ergocalciferol, (DRISDOL) 50000 UNITS CAPS Take 50,000 Units by mouth every 7 (seven) days.     [name] zoster vaccine live, PF, (ZOSTAVAX) 73419 UNT/0.65ML injection Inject 19,400 Units into the skin once. 1 each 0   No current facility-administered medications on file prior to visit.   Allergies  Allergen Reactions  . Atorvastatin     REACTION: Memory problems  . Codeine Sulfate   . Metformin     REACTION: GI problems     Review of Systems: [x]  complains of  [  ] denies General:   [  ] Recent weight change [  ] Fatigue  [  ]  Loss of appetite Eyes: [ x ]  Vision Difficulty [  ]  Eye pain ENT: [  ]  Hearing difficulty [  ]  Difficulty Swallowing CVS: [  ] Chest pain [  ]  Palpitations/Irregular Heart beat [  ]  Shortness of breath lying flat [  ] Swelling of legs Resp: [  ] Frequent Cough [  ] Shortness of Breath  [  ]  Wheezing GI: [  ] Heartburn  [  ] Nausea or Vomiting  [ x ] Diarrhea [ x ] Constipation  [  ] Abdominal Pain GU: [  ]  Polyuria  [ x ]  nocturia Bones/joints:  [  ]  Muscle aches  [  ] Joint Pain  [  ] Bone pain Skin/Hair/Nails: [  ]  Rash  [  ] New stretch marks [  ]  Itching [ x ] Hair loss [  ]  Excessive hair growth Reproduction: [ x ] Low sexual desire , [  ]  Women: Menstrual cycle problems [  ]  Women: Breast Discharge [  ] Men: Difficulty with erections [  ]  Men: Enlarged Breasts CNS: [  ] Frequent Headaches [  x] Blurry vision [  ] Tremors [  ] Seizures [  ] Loss of consciousness [  ] Localized weakness Endocrine: [  ]  Excess thirst [ x ]  Feeling excessively hot [  ]  Feeling excessively cold Heme: [  ]  Easy bruising [  ]  Enlarged glands or lumps in neck Allergy: [  ]  Food allergies [  ] Environmental allergies  PE: BP 122/74 mmHg  Pulse 80  Wt 209 lb 4 oz (94.915 kg)  SpO2 95%  LMP 01/29/2014 (Approximate) Wt Readings from Last 3 Encounters:  07/16/14 209 lb 4 oz (94.915 kg)  07/04/14 208 lb 8 oz (94.575 kg)  03/28/14 209 lb (94.802 kg)   GENERAL: No acute distress, well developed HEENT:  Eye exam shows normal external appearance. Oral exam  shows normal mucosa .  NECK:   Neck exam shows no lymphadenopathy. No Carotids bruits. Thyroid is not enlarged and no nodules felt.  no acanthosis nigricans, + skin tags LUNGS:         Chest is symmetrical. Lungs are clear to auscultation.[name]   HEART:         Heart sounds:  S1 and S2 are normal. No murmurs or clicks heard. ABDOMEN:  No Distention present. Liver and spleen are not palpable. No other mass or tenderness present.  EXTREMITIES:     There is no edema. 2+ DP pulses  NEUROLOGICAL:     Grossly intact.            Diabetic foot exam done with shoes and socks removed: Normal Monofilament testing bilaterally. No deformities of toes.  Nails  Not dystrophic. Skin normal color. No open wounds. Dry skin.  MUSCULOSKELETAL:       There is no enlargement or gross deformity of the joints.  SKIN:       No rash  ASSESSMENT AND PLAN: Problem List Items Addressed This Visit      Cardiovascular and Mediastinum   Essential hypertension    BP at target today on current regimen. Last urine MA negative November 2015.       Endocrine   DM (diabetes mellitus), type 2, uncontrolled, with renal complications - Primary    Recent A1c and sugars are not at goal. Discussed  improving her sugar control to around A1c of 7.5%.   Discussed changes to diet and exercise regimen, and she is going to start making these as she can.  Discussed importance of checking sugars 3- 4 x daily.   Discussed various treatment options including switch to basal/bolus insulin, add SGLT2 versus GLP-1, going back to premixed insulin or switching over to concentrated insulin since she is requiring about 220 units of insulin daily.   She has elected to try U-500 insulin.  Have asked her to stop NPH and Humalog insulin from today and start U-500 insulin at 11 units three times daily from tomorrow. Insulin dosing and U-500 dosing explained in detail to the patient.    She will report back if she starts to have problems with her sugars.    RTC 3 weeks.     Relevant Medications      insulin regular human CONCENTRATED (HUMULIN R) 500 UNIT/ML SOLN injection     Genitourinary   CKD stage 3 due to type 2 diabetes mellitus    Recent GFR stable and c/w Stage  3 CKD    Relevant Medications      insulin regular human CONCENTRATED (HUMULIN R) 500 UNIT/ML SOLN injection     Other   Hypercholesterolemia with hypertriglyceridemia    Lipids are not controlled. Patient previously refused statin therapy and recalls not tolerating Atorvastatin in the past.  TGs are improving on fenofibrate. Followed by PCP.           - Return to clinic in 3 weeks with sugar log/meter.  Imaad Reuss [facility] 07/16/2014 3:29 PM

## 2014-07-16 NOTE — Patient Instructions (Addendum)
Check sugars 3 times daily ( fasting and premeal readings at alternating times, or at bedtime).  Record them in a sugar log and bring log and meter to next appointment.   Stop NPH and Humalog insulin.  Start Humulin R-500 insulin at 11 units three times daily ( 8am, 2pm, 9 pm)  Notify if problem with the sugars.   RTC 3 weeks.

## 2014-08-06 ENCOUNTER — Encounter (INDEPENDENT_AMBULATORY_CARE_PROVIDER_SITE_OTHER): Payer: Self-pay

## 2014-08-06 ENCOUNTER — Ambulatory Visit (INDEPENDENT_AMBULATORY_CARE_PROVIDER_SITE_OTHER): Payer: BC Managed Care – PPO | Admitting: Endocrinology

## 2014-08-06 ENCOUNTER — Encounter: Payer: Self-pay | Admitting: Endocrinology

## 2014-08-06 VITALS — BP 122/78 | HR 97 | Ht 67.5 in | Wt 207.8 lb

## 2014-08-06 DIAGNOSIS — E1129 Type 2 diabetes mellitus with other diabetic kidney complication: Secondary | ICD-10-CM

## 2014-08-06 DIAGNOSIS — E1122 Type 2 diabetes mellitus with diabetic chronic kidney disease: Secondary | ICD-10-CM

## 2014-08-06 DIAGNOSIS — IMO0002 Reserved for concepts with insufficient information to code with codable children: Secondary | ICD-10-CM

## 2014-08-06 DIAGNOSIS — E1165 Type 2 diabetes mellitus with hyperglycemia: Secondary | ICD-10-CM

## 2014-08-06 DIAGNOSIS — I10 Essential (primary) hypertension: Secondary | ICD-10-CM

## 2014-08-06 DIAGNOSIS — N183 Chronic kidney disease, stage 3 unspecified: Secondary | ICD-10-CM

## 2014-08-06 NOTE — Progress Notes (Signed)
Reason for visit-  Pamela Harris is a 58 y.o.-year-old female, here for management of Type 2 diabetes, uncontrolled, with complications ( stage 3 CKD, retinopathy). Last visit 3 weeks ago.    HPI- Patient has been diagnosed with diabetes in 1996. Recalls being initially on lifestyle modifications.  Tried  Metformin, Actos/Avandia, Byetta prior. she has  been on insulin for majority of the time after the DKA episode in the 1990s.   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 * also tried on other insulins in the past, 75/25 , 70/30 and now most recently is on the combination below.  *Recalls Lantus was not covered under her plan several years ago.  * switched from NPH and Humalog ss qac to Humulin U- 500 in Nov 2015  Pt is currently on a regimen of: - Humulin U 500 at 11 units three times daily * takes it before meals at 8am, 2pm, 9 pm *occasionally taken after the meals during weekends      Last hemoglobin A1c was: Lab Results  Component Value Date   HGBA1C 8.5* 07/02/2014   HGBA1C 7.2* 01/11/2014   HGBA1C 8.3* 10/12/2013     Pt checks her sugars 2-4 x a day -recently. Uses accuchek compact plus glucometer. By meter download/meter review they are:  PREMEAL Breakfast Lunch Dinner Bedtime Overall  Glucose range: 159-239 182-261 129-233 254-354   Mean/median:        POST-MEAL PC Breakfast PC Lunch PC Dinner  Glucose range:     Mean/median:       Hypoglycemia-  Didn't check during this episode. Shaky after inadequate intake after lunch and exertion- one episode- corrected with food  Dietary habits- eats three times daily. Tries to limit carbs, sweetened beverages, sodas, desserts. Didn't do as well with cookies and cakes/pies over the holidays with left overs lasting for days  Exercise-  Has  equipment at home-TD, bike and elliptical. Riding the bike 15 minutes daily since last time.   Weight -  Wt Readings from Last 3 Encounters:  08/06/14 207 lb 12 oz (94.235 kg)  07/16/14 209 lb 4 oz (94.915 kg)  07/04/14 208 lb 8 oz (94.575 kg)    Diabetes Complications-  Nephropathy- Yes , stage 3  CKD, last BUN/creatinine- GFR 54 Lab Results  Component Value Date   BUN 20 07/02/2014   CREATININE 1.1 07/02/2014   Lab Results  Component Value Date   GFR 54.48* 07/02/2014   MICRALBCREAT 0.5 07/02/2014     Retinopathy- Yes, Last DEE was 2015, in 2008 hemorrhages right eye- had laser surgery, told about retinopathy then- now goes to [facility] 2x yearly Retinal surgery in right eye, cataract surgery right eye, developing cataracts left eye   Neuropathy- no numbness and tingling in her feet. No known neuropathy. Starting to notice occasional burning of feet. Reports occasional numbness in left great toe since her ankle surgery.  Associated history - No CAD . No prior stroke. No hypothyroidism. her last TSH was  Lab Results  Component Value Date   TSH 2.60 10/12/2013    Hyperlipidemia-  her last set of lipids were- Currently on fish oil and fenofibrate. Tolerating well. Allergic to Atorvastatin due to memory problems and muscle weakness.   Lab Results  Component Value Date   CHOL 232* 07/02/2014  HDL 33.10* 07/02/2014   LDLCALC 159* 07/02/2014   LDLDIRECT 178.2 10/12/2013   TRIG 200.0* 07/02/2014   CHOLHDL 7 07/02/2014    Blood Pressure/HTN- Patient's blood pressure is well controlled today on current regimen that includes ARB ( Cozaar).  I have reviewed the patient's past medical history,  medications and allergies.   Current Outpatient Prescriptions on File Prior to Visit  Medication Sig Dispense Refill  . amLODipine (NORVASC) 10 MG tablet TAKE ONE TABLET BY MOUTH DAILY 90 tablet 1  . atenolol (TENORMIN) 50 MG tablet TAKE ONE TABLET BY MOUTH DAILY 90 tablet 1   . fenofibrate 160 MG tablet TAKE ONE TABLET BY MOUTH DAILY 90 tablet 1  . fish oil-omega-3 fatty acids 1000 MG capsule Take 1 g by mouth 2 (two) times daily.    [name] glucose blood (ACCU-CHEK AVIVA) test strip Check sugars twice daily 100 each 11  . HEALTHY ACCENTS UNIFINE PENTIP 31G X 8 MM MISC USE AS DIRECTED 100 each PRN  . insulin regular human CONCENTRATED (HUMULIN R) 500 UNIT/ML SOLN injection Use 11 units three times daily. Use with U-100 syringe 20 mL 3  . Insulin Syringe-Needle U-100 (INSULIN SYRINGE .3CC/31GX5/16") 31G X 5/16" 0.3 ML MISC Use for insulin administration three times daily. 100 each 6  . losartan (COZAAR) 100 MG tablet TAKE ONE TABLET BY MOUTH DAILY 90 tablet 1  . Multiple Vitamins-Minerals (MULTIVITAMIN WITH MINERALS) tablet Take 1 tablet by mouth daily.    . Probiotic Product (PROBIOTIC MATURE ADULT) CAPS Take 1 capsule by mouth daily.    . Vitamin D, Ergocalciferol, (DRISDOL) 50000 UNITS CAPS Take 50,000 Units by mouth every 7 (seven) days.    [name] zoster vaccine live, PF, (ZOSTAVAX) 88502 UNT/0.65ML injection Inject 19,400 Units into the skin once. 1 each 0   No current facility-administered medications on file prior to visit.   Allergies  Allergen Reactions  . Atorvastatin     REACTION: Memory problems  . Codeine Sulfate   . Metformin     REACTION: GI problems     Review of Systems- [ x ]  Complains of    [  ]  denies [  ] Recent weight change [  ]  Fatigue [  ] polydipsia [  ] polyuria [  ]  nocturia [  ]  vision difficulty [  ] chest pain [  ] shortness of breath [  ] leg swelling [  ] cough [  ] nausea/vomiting [  ] diarrhea [  ] constipation [  ] abdominal pain [  ]  tingling/numbness in extremities [  ]  concern with feet ( wounds/sores)   PE: BP 122/78 mmHg  Pulse 97  Wt 207 lb 12 oz (94.235 kg)  SpO2 97% Wt Readings from Last 3 Encounters:  08/06/14 207 lb 12 oz (94.235 kg)  07/16/14 209 lb 4 oz (94.915 kg)  07/04/14 208 lb 8 oz (94.575 kg)    Exam: deferred  ASSESSMENT AND PLAN: Problem List Items Addressed This Visit      Cardiovascular and Mediastinum   Essential hypertension    BP at target today on current regimen. Last urine MA negative November 2015.         Endocrine   DM (diabetes mellitus), type 2, uncontrolled, with renal complications - Primary    Recent A1c and sugars are not at goal. Discussed improving her sugar control to around A1c of 7.5%.   Discussed changes to diet and exercise regimen, and  she is going to start making these as she can.  Discussed importance of checking sugars 3- 4 x daily.   Change U-500 to 14 units with BF, 10 units lunch and 13 units with supper.   She will report back if she starts to have problems with her sugars or starts having low sugars.   RTC 5 weeks.         Genitourinary   CKD stage 3 due to type 2 diabetes mellitus    Recent GFR stable and c/w Stage  3 CKD            - Return to clinic in 5 weeks with sugar log/meter.  Amillion Macchia [facility] 08/06/2014 8:57 AM

## 2014-08-06 NOTE — Patient Instructions (Addendum)
Check sugars 4 x daily ( before each meal and at bedtime).  Record them in a log book and bring that/meter to next appointment.   Take insulin before meals around 20 minutes.  Consistency in insulin timing and food intake is important.   Change U-500 dose to 14 units with BF, 10 units lunch and 13 units with supper.   As you eat better, you might start to see lows - in which case please notify me for further adjustments and down titration of insulin.   Please come back for a follow-up appointment in 5 weeks.

## 2014-08-06 NOTE — Progress Notes (Signed)
Pre visit review using our clinic review tool, if applicable. No additional management support is needed unless otherwise documented below in the visit note. 

## 2014-08-06 NOTE — Assessment & Plan Note (Signed)
Recent GFR stable and c/w Stage  3 CKD     

## 2014-08-06 NOTE — Assessment & Plan Note (Signed)
Recent A1c and sugars are not at goal. Discussed improving her sugar control to around A1c of 7.5%.   Discussed changes to diet and exercise regimen, and she is going to start making these as she can.  Discussed importance of checking sugars 3- 4 x daily.   Change U-500 to 14 units with BF, 10 units lunch and 13 units with supper.   She will report back if she starts to have problems with her sugars or starts having low sugars.   RTC 5 weeks.

## 2014-08-06 NOTE — Assessment & Plan Note (Signed)
BP at target today on current regimen. Last urine MA negative November 2015.          

## 2014-08-19 ENCOUNTER — Other Ambulatory Visit: Payer: Self-pay | Admitting: Internal Medicine

## 2014-08-20 ENCOUNTER — Other Ambulatory Visit: Payer: Self-pay

## 2014-08-20 MED ORDER — INSULIN REGULAR HUMAN (CONC) 500 UNIT/ML ~~LOC~~ SOLN: SUBCUTANEOUS | Status: DC

## 2014-09-12 ENCOUNTER — Encounter: Payer: Self-pay | Admitting: Endocrinology

## 2014-09-12 ENCOUNTER — Ambulatory Visit (INDEPENDENT_AMBULATORY_CARE_PROVIDER_SITE_OTHER): Payer: BLUE CROSS/BLUE SHIELD | Admitting: Endocrinology

## 2014-09-12 ENCOUNTER — Telehealth: Payer: Self-pay

## 2014-09-12 VITALS — BP 122/64 | HR 75 | Resp 14 | Ht 67.5 in | Wt 210.2 lb

## 2014-09-12 DIAGNOSIS — E1129 Type 2 diabetes mellitus with other diabetic kidney complication: Secondary | ICD-10-CM

## 2014-09-12 DIAGNOSIS — M779 Enthesopathy, unspecified: Secondary | ICD-10-CM

## 2014-09-12 DIAGNOSIS — N183 Chronic kidney disease, stage 3 (moderate): Secondary | ICD-10-CM

## 2014-09-12 DIAGNOSIS — E1122 Type 2 diabetes mellitus with diabetic chronic kidney disease: Secondary | ICD-10-CM

## 2014-09-12 DIAGNOSIS — IMO0002 Reserved for concepts with insufficient information to code with codable children: Secondary | ICD-10-CM

## 2014-09-12 DIAGNOSIS — M7751 Other enthesopathy of right foot: Secondary | ICD-10-CM

## 2014-09-12 DIAGNOSIS — E1165 Type 2 diabetes mellitus with hyperglycemia: Secondary | ICD-10-CM

## 2014-09-12 DIAGNOSIS — I10 Essential (primary) hypertension: Secondary | ICD-10-CM

## 2014-09-12 NOTE — Assessment & Plan Note (Signed)
Recent GFR stable and c/w Stage  3 CKD

## 2014-09-12 NOTE — Assessment & Plan Note (Signed)
BP at target today on current regimen. Last urine MA negative November 2015.

## 2014-09-12 NOTE — Progress Notes (Signed)
Pre visit review using our clinic review tool, if applicable. No additional management support is needed unless otherwise documented below in the visit note. 

## 2014-09-12 NOTE — Assessment & Plan Note (Signed)
Recent A1c and sugars are not at goal,but sugars are getting better.   We talked about increasing her exercise to 25 minutes daily.  Discussed importance of checking sugars 3- 4 x daily.   Change U-500 to 13 units with BF, 11 units lunch and 12 units with supper ( now she will take them corresponding to her meal times to see whether this works better- 7am, 1pm, 7pm).   She will report back if she starts to have problems with her sugars or starts having low sugars.   Likely bony spur over right foot- asked her to change her shoes so that they are not causing additional friction ( clogs), tylenol, rest and I will check with PCP as to whether they have any further suggestions. She was also asked to call back if no improvement or worsening.  RTC 4 weeks. Labs done.

## 2014-09-12 NOTE — Patient Instructions (Addendum)
Check sugars 3 times daily ( fasting and premeal readings at alternating times, or at bedtime).  Record them in a sugar log and bring log and meter to next appointment.   Change U 500 to 13 units at 7 am, 11 units at 1pm, 12 units at 7 pm to correspond half an hour prior to meal times.   Notify me if you start having lows.   Foot care as discussed, tylenol, rest, change of shoes. Will check with PCP whether they have any other suggestions.   Please come back for a follow-up appointment in 1 month.

## 2014-09-12 NOTE — Progress Notes (Signed)
Reason for visit-  Pamela Harris is a 59 y.o.-year-old female, here for management of Type 2 diabetes, uncontrolled, with complications ( stage 3 CKD, retinopathy). Last visit 6 weeks ago.    HPI- Patient has been diagnosed with diabetes in 1996. Recalls being initially on lifestyle modifications.  Tried  Metformin, Actos/Avandia, Byetta prior. she has  been on insulin for majority of the time after the DKA episode in the 1990s.   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 * also tried on other insulins in the past, 75/25 , 70/30 and now most recently is on the combination below.  *Recalls Lantus was not covered under her plan several years ago.  * switched from NPH and Humalog ss qac to Humulin U- 500 in Nov 2015  Pt is currently on a regimen of: - Humulin U 500 at 13/12/13  Instead of recemmended 14 units BF, 10 units lunch and 13 units supper * takes it before meals at 8am, 2pm, and post supper at 9 pm *occasionally taken after the meals during weekends * Eating more at lunch time since more food then, adjusted BF insulin by herself   Last hemoglobin A1c was: Lab Results  Component Value Date   HGBA1C 8.5* 07/02/2014   HGBA1C 7.2* 01/11/2014   HGBA1C 8.3* 10/12/2013     Pt checks her sugars 2-4 x a day -recently. Uses accuchek compact plus glucometer. By meter download/meter review they are: Night time higher readings as takes insulin at qhs , usually 2 hours post supper.  PREMEAL Breakfast Lunch Dinner Bedtime Overall  Glucose range: 106-246 134-176 73-188 76-361   Mean/median:        POST-MEAL PC Breakfast PC Lunch PC Dinner  Glucose range:     Mean/median:       Hypoglycemia-  Some lows during evening prior to supper, usually hungier at that time, lowest is in the 70s   Dietary  habits- eats three times daily. Tries to limit carbs, sweetened beverages, sodas, desserts. Didn't do as well with cookies and cakes/pies over the holidays with left overs lasting for days. Now doing better.   Exercise-  Has equipment at home-TD, bike and elliptical. Riding the bike 15 minutes daily since last time.   Weight - going up Wt Readings from Last 3 Encounters:  09/12/14 210 lb 4 oz (95.369 kg)  08/06/14 207 lb 12 oz (94.235 kg)  07/16/14 209 lb 4 oz (94.915 kg)    Diabetes Complications-  Nephropathy- Yes , stage 3  CKD, last BUN/creatinine- GFR 54 Lab Results  Component Value Date   BUN 20 07/02/2014   CREATININE 1.1 07/02/2014   Lab Results  Component Value Date   GFR 54.48* 07/02/2014   MICRALBCREAT 0.5 07/02/2014     Retinopathy- Yes, Last DEE was 2015, in 2008 hemorrhages right eye- had laser surgery, told about retinopathy then- now goes to [facility] 2x yearly Retinal surgery in right eye, cataract surgery right eye, developing cataracts left eye   Neuropathy- no numbness and tingling in her feet. No known neuropathy. Starting to notice occasional burning of feet. Reports occasional numbness in left great toe since her ankle surgery.  Associated history - No CAD . No prior stroke. No hypothyroidism. her last TSH was  Lab Results  Component Value Date   TSH  2.60 10/12/2013    Hyperlipidemia-  her last set of lipids were- Currently on fish oil and fenofibrate. Tolerating well. Allergic to Atorvastatin due to memory problems and muscle weakness.  Also recalls trying 2 other statins (lovastatin and some other) and did not tolerate it Lab Results  Component Value Date   CHOL 232* 07/02/2014   HDL 33.10* 07/02/2014   LDLCALC 159* 07/02/2014   LDLDIRECT 178.2 10/12/2013   TRIG 200.0* 07/02/2014   CHOLHDL 7 07/02/2014    Blood Pressure/HTN- Patient's blood pressure is well controlled today on current regimen that includes ARB ( Cozaar).  Swelling on  right heel -localized, small, since 2.5 weeks, suddenly tender, she changed her shoes and symptoms have persisted, no open wounds, swelling same size,   I have reviewed the patient's past medical history,  medications and allergies.   Current Outpatient Prescriptions on File Prior to Visit  Medication Sig Dispense Refill  . amLODipine (NORVASC) 10 MG tablet TAKE ONE TABLET BY MOUTH DAILY 90 tablet 1  . atenolol (TENORMIN) 50 MG tablet TAKE ONE TABLET BY MOUTH DAILY 90 tablet 1  . fenofibrate 160 MG tablet TAKE ONE TABLET BY MOUTH DAILY 90 tablet 1  . fish oil-omega-3 fatty acids 1000 MG capsule Take 1 g by mouth 2 (two) times daily.    [name] glucose blood (ACCU-CHEK AVIVA) test strip Check sugars twice daily 100 each 11  . HEALTHY ACCENTS UNIFINE PENTIP 31G X 8 MM MISC USE AS DIRECTED 100 each PRN  . insulin regular human CONCENTRATED (HUMULIN R) 500 UNIT/ML SOLN injection Use 11 units three times daily. Use with U-100 syringe 20 mL 3  . Insulin Syringe-Needle U-100 (INSULIN SYRINGE .3CC/31GX5/16") 31G X 5/16" 0.3 ML MISC Use for insulin administration three times daily. 100 each 6  . losartan (COZAAR) 100 MG tablet TAKE ONE TABLET BY MOUTH DAILY 90 tablet 1  . Multiple Vitamins-Minerals (MULTIVITAMIN WITH MINERALS) tablet Take 1 tablet by mouth daily.    . Probiotic Product (PROBIOTIC MATURE ADULT) CAPS Take 1 capsule by mouth daily.    . Vitamin D, Ergocalciferol, (DRISDOL) 50000 UNITS CAPS Take 50,000 Units by mouth every 7 (seven) days.    [name] zoster vaccine live, PF, (ZOSTAVAX) 99371 UNT/0.65ML injection Inject 19,400 Units into the skin once. 1 each 0   No current facility-administered medications on file prior to visit.   Allergies  Allergen Reactions  . Atorvastatin     REACTION: Memory problems  . Codeine Sulfate   . Metformin     REACTION: GI problems     Review of Systems- [ x ]  Complains of    [  ]  denies [  ] Recent weight change [  ]  Fatigue [  ] polydipsia [  ] polyuria [   ]  nocturia [  ]  vision difficulty [  ] chest pain [  ] shortness of breath [  ] leg swelling [  ] cough [  ] nausea/vomiting [  ] diarrhea [  ] constipation [  ] abdominal pain [  ]  tingling/numbness in extremities [ x ]  concern with feet ( wounds/sores)   PE: BP 122/64 mmHg  Pulse 75  Resp 14  Ht 5' 7.5" (1.715 m)  Wt 210 lb 4 oz (95.369 kg)  BMI 32.42 kg/m2  SpO2 97% Wt Readings from Last 3 Encounters:  09/12/14 210 lb 4 oz (95.369 kg)  08/06/14 207 lb 12 oz (94.235 kg)  07/16/14 209  lb 4 oz (94.915 kg)   GENERAL: No acute distress, well developed EXTREMITIES:     There is no edema. 2+ DP pulses  NEUROLOGICAL:     Grossly intact.            Right foot- Localized 1.5 cm swelling to the left of the achilles tendon insertion, no restriction of ROM, feels like bony spur, small amount of erythema over it, no open wounds or signs of infection, no tenderness over achilles tendon MUSCULOSKELETAL:       There is no enlargement or gross deformity of the joints.  SKIN:       No rash   ASSESSMENT AND PLAN: Problem List Items Addressed This Visit      Cardiovascular and Mediastinum   Essential hypertension    BP at target today on current regimen. Last urine MA negative November 2015.             Endocrine   DM (diabetes mellitus), type 2, uncontrolled, with renal complications - Primary    Recent A1c and sugars are not at goal,but sugars are getting better.   We talked about increasing her exercise to 25 minutes daily.  Discussed importance of checking sugars 3- 4 x daily.   Change U-500 to 13 units with BF, 11 units lunch and 12 units with supper ( now she will take them corresponding to her meal times to see whether this works better- 7am, 1pm, 7pm).   She will report back if she starts to have problems with her sugars or starts having low sugars.   Likely bony spur over right foot- asked her to change her shoes so that they are not causing additional friction (  clogs), tylenol, rest and I will check with PCP as to whether they have any further suggestions. She was also asked to call back if no improvement or worsening.  RTC 4 weeks. Labs done.             Genitourinary   CKD stage 3 due to type 2 diabetes mellitus    Recent GFR stable and c/w Stage  3 CKD            Bone spur of right foot- Plan as above.     - Return to clinic in 4 weeks with sugar log/meter.  Avneet Ashmore [facility] 09/12/2014 9:28 AM

## 2014-09-12 NOTE — Telephone Encounter (Signed)
Spoke to patient per Dr. Boyd Kerbs request. Informed patient if foot is not getting any better by next week to call the office to schedule an appointment with Dr. Derrel Nip for further evaluation.

## 2014-10-14 LAB — HM DIABETES EYE EXAM

## 2014-10-16 ENCOUNTER — Other Ambulatory Visit: Payer: Self-pay

## 2014-10-16 MED ORDER — GLUCOSE BLOOD VI STRP: ORAL_STRIP | Status: DC

## 2014-10-16 NOTE — Telephone Encounter (Signed)
[facility] request Rx for 90 day supply. Patient testing TID.

## 2014-10-17 ENCOUNTER — Encounter: Payer: Self-pay | Admitting: Endocrinology

## 2014-10-17 ENCOUNTER — Ambulatory Visit (INDEPENDENT_AMBULATORY_CARE_PROVIDER_SITE_OTHER): Payer: BLUE CROSS/BLUE SHIELD | Admitting: Endocrinology

## 2014-10-17 ENCOUNTER — Other Ambulatory Visit: Payer: Self-pay | Admitting: Endocrinology

## 2014-10-17 VITALS — BP 126/68 | HR 70 | Resp 16 | Ht 67.5 in | Wt 210.8 lb

## 2014-10-17 DIAGNOSIS — IMO0002 Reserved for concepts with insufficient information to code with codable children: Secondary | ICD-10-CM

## 2014-10-17 DIAGNOSIS — E1122 Type 2 diabetes mellitus with diabetic chronic kidney disease: Secondary | ICD-10-CM

## 2014-10-17 DIAGNOSIS — I10 Essential (primary) hypertension: Secondary | ICD-10-CM

## 2014-10-17 DIAGNOSIS — E782 Mixed hyperlipidemia: Secondary | ICD-10-CM

## 2014-10-17 DIAGNOSIS — N183 Chronic kidney disease, stage 3 unspecified: Secondary | ICD-10-CM

## 2014-10-17 DIAGNOSIS — E1129 Type 2 diabetes mellitus with other diabetic kidney complication: Secondary | ICD-10-CM

## 2014-10-17 DIAGNOSIS — E1165 Type 2 diabetes mellitus with hyperglycemia: Secondary | ICD-10-CM

## 2014-10-17 LAB — COMPREHENSIVE METABOLIC PANEL
ALT: 18 U/L (ref 0–35)
AST: 12 U/L (ref 0–37)
Albumin: 4.1 g/dL (ref 3.5–5.2)
Alkaline Phosphatase: 52 U/L (ref 39–117)
BILIRUBIN TOTAL: 0.4 mg/dL (ref 0.2–1.2)
BUN: 25 mg/dL — ABNORMAL HIGH (ref 6–23)
CO2: 27 meq/L (ref 19–32)
CREATININE: 1.08 mg/dL (ref 0.40–1.20)
Calcium: 9.4 mg/dL (ref 8.4–10.5)
Chloride: 104 mEq/L (ref 96–112)
GFR: 55.59 mL/min — AB (ref >60.00)
GLUCOSE: 137 mg/dL — AB (ref 70–99)
Potassium: 4.5 mEq/L (ref 3.5–5.1)
Sodium: 138 mEq/L (ref 135–145)
TOTAL PROTEIN: 6.8 g/dL (ref 6.0–8.3)

## 2014-10-17 LAB — LIPID PANEL
CHOLESTEROL: 230 mg/dL — AB (ref 0–200)
HDL: 39.1 mg/dL (ref >39.00)
NonHDL: 190.9
Total CHOL/HDL Ratio: 6
Triglycerides: 215 mg/dL — ABNORMAL HIGH (ref 0.0–149.0)
VLDL: 43 mg/dL — ABNORMAL HIGH (ref 0.0–40.0)

## 2014-10-17 LAB — POCT GLUCOSE (DEVICE FOR HOME USE)

## 2014-10-17 LAB — LDL CHOLESTEROL, DIRECT: LDL DIRECT: 152 mg/dL

## 2014-10-17 LAB — HEMOGLOBIN A1C: HEMOGLOBIN A1C: 8.3 % — AB (ref 4.6–6.5)

## 2014-10-17 MED ORDER — EZETIMIBE 10 MG PO TABS: 10.0000 mg | ORAL_TABLET | Freq: Every day | ORAL | Status: DC

## 2014-10-17 NOTE — Assessment & Plan Note (Signed)
BP at target today on current regimen. Last urine MA negative November 2015.

## 2014-10-17 NOTE — Assessment & Plan Note (Signed)
Lipids are not controlled. Patient previously refused statin therapy and recalls not tolerating Atorvastatin in the past.  TGs are improving on fenofibrate. Consider Zetia if LDL still not controlled. Patient is agreeable. She is on high dose Vitamin D per nephrology.

## 2014-10-17 NOTE — Progress Notes (Signed)
Pre visit review using our clinic review tool, if applicable. No additional management support is needed unless otherwise documented below in the visit note. 

## 2014-10-17 NOTE — Progress Notes (Signed)
Reason for visit-  Pamela Harris is a 59 y.o.-year-old female, here for management of Type 2 diabetes, uncontrolled, with complications ( stage 3 CKD, retinopathy). Last visit 4 weeks ago.    HPI- Patient has been diagnosed with diabetes in 1996. Recalls being initially on lifestyle modifications.  Tried  Metformin, Actos/Avandia, Byetta prior. she has  been on insulin for majority of the time after the DKA episode in the 1990s.   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 * also tried on other insulins in the past, 75/25 , 70/30 and now most recently is on the combination below.  *Recalls Lantus was not covered under her plan several years ago.  * switched from NPH and Humalog ss qac to Humulin U- 500 in Nov 2015  Pt is currently on a regimen of: - Humulin U 500 at 13 units BF, 11 units lunch and 12 units supper * takes it before meals at 7am, 1pm, and post supper at 7 pm>>this is working better with her eating schedule *occasionally taken after the meals during weekends   Last hemoglobin A1c was: Lab Results  Component Value Date   HGBA1C 8.5* 07/02/2014   HGBA1C 7.2* 01/11/2014   HGBA1C 8.3* 10/12/2013     Pt checks her sugars 2-4 x a day -recently. Uses accuchek compact plus glucometer. By meter download/meter review they are:   PREMEAL Breakfast Lunch Dinner Bedtime Overall  Glucose range: 164-257 88-252 74-235 102-285   Mean/median:        POST-MEAL PC Breakfast PC Lunch PC Dinner  Glucose range:     Mean/median:       Hypoglycemia-  Some lows recently prior to supper/lunch cutting back on snacking and portion sizes, lowest is in the 70s   Dietary habits- eats three times daily. Tries to limit carbs, sweetened beverages, sodas, desserts.   Exercise-  Has equipment at home-TD, bike  and elliptical. Riding the bike 15 minutes daily since last time.   Weight -  Wt Readings from Last 3 Encounters:  10/17/14 210 lb 12 oz (95.596 kg)  09/12/14 210 lb 4 oz (95.369 kg)  08/06/14 207 lb 12 oz (94.235 kg)    Diabetes Complications-  Nephropathy- Yes , stage 3  CKD, last BUN/creatinine- Lab Results  Component Value Date   BUN 20 07/02/2014   CREATININE 1.1 07/02/2014   Lab Results  Component Value Date   GFR 54.48* 07/02/2014   MICRALBCREAT 0.5 07/02/2014     Retinopathy- Yes, Last DEE was 2015, in 2008 hemorrhages right eye- had laser surgery, told about retinopathy then- now goes to [facility] 2x yearly Retinal surgery in right eye, cataract surgery right eye, developing cataracts left eye   Neuropathy- no numbness and tingling in her feet. No known neuropathy. Starting to notice occasional burning of feet. Reports occasional numbness in left great toe since her ankle surgery.  Associated history - No CAD . No prior stroke. No hypothyroidism. her last TSH was  Lab Results  Component Value Date   TSH 2.60 10/12/2013    Hyperlipidemia-  her last set of lipids were- Currently on fish oil and fenofibrate. Tolerating well. Allergic to Atorvastatin due to memory problems and muscle weakness.  Also recalls trying 2 other statins (lovastatin and some other) and did not tolerate it Lab Results  Component Value Date   CHOL 232* 07/02/2014   HDL 33.10* 07/02/2014   LDLCALC 159* 07/02/2014   LDLDIRECT 178.2 10/12/2013   TRIG 200.0* 07/02/2014   CHOLHDL 7 07/02/2014    Blood Pressure/HTN- Patient's blood pressure is well controlled today on current regimen that includes ARB ( Cozaar).   I have reviewed the patient's past medical history,  medications and allergies.   Current Outpatient Prescriptions on File Prior to Visit  Medication Sig Dispense Refill  . amLODipine (NORVASC) 10 MG tablet TAKE ONE TABLET BY MOUTH DAILY 90 tablet 1  . atenolol (TENORMIN)  50 MG tablet TAKE ONE TABLET BY MOUTH DAILY 90 tablet 1  . fenofibrate 160 MG tablet TAKE ONE TABLET BY MOUTH DAILY 90 tablet 1  . fish oil-omega-3 fatty acids 1000 MG capsule Take 1 g by mouth 2 (two) times daily.    [name] glucose blood (ACCU-CHEK AVIVA) test strip Check sugars three times daily 306 each 2  . HEALTHY ACCENTS UNIFINE PENTIP 31G X 8 MM MISC USE AS DIRECTED 100 each PRN  . insulin regular human CONCENTRATED (HUMULIN R) 500 UNIT/ML SOLN injection Use 11 units three times daily. Use with U-100 syringe (Patient taking differently: 3 (three) times daily. Use 13 units at 7am, 11 units at 1pm and 12 at 7pm  daily. Use with U-100 syringe) 20 mL 3  . Insulin Syringe-Needle U-100 (INSULIN SYRINGE .3CC/31GX5/16") 31G X 5/16" 0.3 ML MISC Use for insulin administration three times daily. 100 each 6  . losartan (COZAAR) 100 MG tablet TAKE ONE TABLET BY MOUTH DAILY 90 tablet 1  . Multiple Vitamins-Minerals (MULTIVITAMIN WITH MINERALS) tablet Take 1 tablet by mouth daily.    . Probiotic Product (PROBIOTIC MATURE ADULT) CAPS Take 1 capsule by mouth daily.    . Vitamin D, Ergocalciferol, (DRISDOL) 50000 UNITS CAPS Take 50,000 Units by mouth every 7 (seven) days.    [name] zoster vaccine live, PF, (ZOSTAVAX) 81829 UNT/0.65ML injection Inject 19,400 Units into the skin once. 1 each 0   No current facility-administered medications on file prior to visit.   Allergies  Allergen Reactions  . Atorvastatin     REACTION: Memory problems  . Codeine Sulfate   . Metformin     REACTION: GI problems     Review of Systems- [ x ]  Complains of    [  ]  denies [  ] Recent weight change [  ]  Fatigue [  ] polydipsia [  ] polyuria [  ]  nocturia [  ]  vision difficulty [  ] chest pain [  ] shortness of breath [  ] leg swelling [  ] cough [  ] nausea/vomiting [  ] diarrhea [  ] constipation [  ] abdominal pain [  ]  tingling/numbness in extremities [  ]  concern with feet ( wounds/sores)    PE: BP 126/68 mmHg   Pulse 70  Resp 16  Ht 5' 7.5" (1.715 m)  Wt 210 lb 12 oz (95.596 kg)  BMI 32.50 kg/m2  SpO2 97% Wt Readings from Last 3 Encounters:  10/17/14 210 lb 12 oz (95.596 kg)  09/12/14 210 lb 4 oz (95.369 kg)  08/06/14 207 lb 12 oz (94.235 kg)   Exam: deferred   ASSESSMENT AND PLAN: Problem List Items Addressed This Visit      Cardiovascular and Mediastinum   Essential hypertension    BP at target today on current regimen. Last urine MA negative November 2015.  Relevant Orders   Comprehensive metabolic panel   Hemoglobin A1c   Lipid panel     Endocrine   DM (diabetes mellitus), type 2, uncontrolled, with renal complications - Primary    Recent A1c and sugars are not at goal,but sugars are getting better. Update A1c today.   Check sugars 3- 4 x daily.   Change U-500 to 14 units with BF, 12 units lunch and 14 units with supper ( now she will take them corresponding to her meal times to see whether this works better- 7am, 1pm, 7pm).   She will report back if she starts to have problems with her sugars or starts having low sugars.  Continue working on diet/exercise.    RTC 4 weeks.            Relevant Orders   Comprehensive metabolic panel   Hemoglobin A1c   Lipid panel     Genitourinary   CKD stage 3 due to type 2 diabetes mellitus    Recent GFR stable and c/w Stage  3 CKD. Follows with Dr Holley Raring. Recheck today.            Relevant Orders   Comprehensive metabolic panel   Hemoglobin A1c   Lipid panel     Other   Hypercholesterolemia with hypertriglyceridemia    Lipids are not controlled. Patient previously refused statin therapy and recalls not tolerating Atorvastatin in the past.  TGs are improving on fenofibrate. Consider Zetia if LDL still not controlled. Patient is agreeable. She is on high dose Vitamin D per nephrology.         Relevant Orders   Comprehensive metabolic panel   Hemoglobin A1c   Lipid panel        - Return  to clinic in 6 weeks with sugar log/meter.  Jereme Loren [facility] 10/17/2014 9:14 AM

## 2014-10-17 NOTE — Assessment & Plan Note (Signed)
Recent GFR stable and c/w Stage  3 CKD. Follows with Dr Holley Raring. Recheck today.

## 2014-10-17 NOTE — Patient Instructions (Signed)
Check sugars 3 times daily ( fasting and premeal readings at alternating times, or at bedtime).  Record them in a sugar log and bring log and meter to next appointment.   Change U 500 to 14 units at 7 am, 12 units at 1pm, 14 units at 7 pm.   Continue to work on diet and exercise  Labs today. Notify if start to have lows.   Please come back for a follow-up appointment in 6 weeks

## 2014-10-17 NOTE — Assessment & Plan Note (Signed)
Recent A1c and sugars are not at goal,but sugars are getting better. Update A1c today.   Check sugars 3- 4 x daily.   Change U-500 to 14 units with BF, 12 units lunch and 14 units with supper ( now she will take them corresponding to her meal times to see whether this works better- 7am, 1pm, 7pm).   She will report back if she starts to have problems with her sugars or starts having low sugars.  Continue working on diet/exercise.    RTC 4 weeks.

## 2014-10-24 ENCOUNTER — Other Ambulatory Visit: Payer: Self-pay | Admitting: Endocrinology

## 2014-10-24 DIAGNOSIS — E113599 Type 2 diabetes mellitus with proliferative diabetic retinopathy without macular edema, unspecified eye: Secondary | ICD-10-CM | POA: Insufficient documentation

## 2014-10-28 ENCOUNTER — Encounter: Payer: Self-pay | Admitting: Endocrinology

## 2014-11-06 ENCOUNTER — Ambulatory Visit: Payer: Self-pay | Admitting: Ophthalmology

## 2014-11-12 ENCOUNTER — Ambulatory Visit: Payer: Self-pay | Admitting: Ophthalmology

## 2014-11-28 ENCOUNTER — Ambulatory Visit (INDEPENDENT_AMBULATORY_CARE_PROVIDER_SITE_OTHER): Payer: BLUE CROSS/BLUE SHIELD | Admitting: Endocrinology

## 2014-11-28 ENCOUNTER — Encounter: Payer: Self-pay | Admitting: Endocrinology

## 2014-11-28 VITALS — BP 124/72 | HR 101 | Resp 14 | Ht 67.5 in | Wt 208.8 lb

## 2014-11-28 DIAGNOSIS — N183 Chronic kidney disease, stage 3 (moderate): Secondary | ICD-10-CM

## 2014-11-28 DIAGNOSIS — I10 Essential (primary) hypertension: Secondary | ICD-10-CM

## 2014-11-28 DIAGNOSIS — IMO0002 Reserved for concepts with insufficient information to code with codable children: Secondary | ICD-10-CM

## 2014-11-28 DIAGNOSIS — E1165 Type 2 diabetes mellitus with hyperglycemia: Secondary | ICD-10-CM

## 2014-11-28 DIAGNOSIS — E1122 Type 2 diabetes mellitus with diabetic chronic kidney disease: Secondary | ICD-10-CM | POA: Diagnosis not present

## 2014-11-28 DIAGNOSIS — E1129 Type 2 diabetes mellitus with other diabetic kidney complication: Secondary | ICD-10-CM

## 2014-11-28 DIAGNOSIS — E782 Mixed hyperlipidemia: Secondary | ICD-10-CM | POA: Diagnosis not present

## 2014-11-28 MED ORDER — INSULIN REGULAR HUMAN (CONC) 500 UNIT/ML ~~LOC~~ SOLN: SUBCUTANEOUS | Status: DC

## 2014-11-28 NOTE — Progress Notes (Signed)
Reason for visit-  Pamela Harris is a 59 y.o.-year-old female, here for management of Type 2 diabetes, uncontrolled, with complications ( stage 3 CKD, retinopathy). Last visit 6 weeks ago.    HPI- Patient has been diagnosed with diabetes in 1996. Recalls being initially on lifestyle modifications.  Tried  Metformin, Actos/Avandia, Byetta prior. she has  been on insulin for majority of the time after the DKA episode in the 1990s.   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 * also tried on other insulins in the past, 75/25 , 70/30 and now most recently is on the combination below.  *Recalls Lantus was not covered under her plan several years ago.  * switched from NPH and Humalog ss qac to Humulin U- 500 in Nov 2015  Pt is currently on a regimen of: - Humulin U 500 at 14 units BF, 12 units lunch and 14 units supper * takes it before meals at 7am, 1pm, and 7 pm>>this is working better with her eating schedule *occasionally taken after the meals during weekends *occasionally adjusted morning dose down due to suppertime hypoglycemia, now back up to above dose after recent cataract surgery left eye, and using steroid drops   Last hemoglobin A1c was: Lab Results  Component Value Date   HGBA1C 8.3* 10/17/2014   HGBA1C 8.5* 07/02/2014   HGBA1C 7.2* 01/11/2014     Pt checks her sugars 2-4 x a day . Uses accuchek compact plus glucometer. By meter download/meter review they are:   PREMEAL Breakfast Lunch Dinner Bedtime Overall  Glucose range: 126-218 136-250 79-273    Mean/median:        POST-MEAL PC Breakfast PC Lunch PC Dinner  Glucose range:     Mean/median:       Hypoglycemia-  Some lows recently prior to supper/lunch cutting back on snacking and portion sizes, lowest is in the 50s   Dietary  habits- eats three times daily. Tries to limit carbs, sweetened beverages, sodas, desserts.   Exercise-  Has equipment at home-TD, bike and elliptical. Riding the bike 15 minutes daily since last time.   Weight -  Wt Readings from Last 3 Encounters:  11/28/14 208 lb 12 oz (94.688 kg)  10/17/14 210 lb 12 oz (95.596 kg)  09/12/14 210 lb 4 oz (95.369 kg)    Diabetes Complications-  Nephropathy- Yes , stage 3  CKD, last BUN/creatinine- Lab Results  Component Value Date   BUN 25* 10/17/2014   CREATININE 1.08 10/17/2014   Lab Results  Component Value Date   GFR 55.59* 10/17/2014   MICRALBCREAT 0.5 07/02/2014     Retinopathy- Yes, Last DEE was 2015, in 2008 hemorrhages right eye- had laser surgery, told about retinopathy then- now goes to [facility] 2x yearly Retinal surgery in right eye, cataract surgery right eye, cataract surgery left eye past week  Neuropathy- no numbness and tingling in her feet. No known neuropathy. Starting to notice occasional burning of feet. Reports occasional numbness in left great toe since her ankle surgery.  Associated history - No CAD . No prior stroke. No hypothyroidism. her last TSH was  Lab Results  Component Value Date   TSH 2.60 10/12/2013    Hyperlipidemia-  her last set of lipids were- Currently on fish oil and fenofibrate, and was supposed to start zetia (  started Feb 2016) but not sure that it will help her with her plaque reduction. Tolerating well. Allergic to Atorvastatin due to memory problems and muscle weakness.  Also recalls trying 2 other statins (lovastatin and some other) and did not tolerate it Lab Results  Component Value Date   CHOL 230* 10/17/2014   HDL 39.10 10/17/2014   LDLCALC 159* 07/02/2014   LDLDIRECT 152.0 10/17/2014   TRIG 215.0* 10/17/2014   CHOLHDL 6 10/17/2014    Blood Pressure/HTN- Patient's blood pressure is well controlled today on current regimen that includes ARB ( Cozaar).   I have reviewed the  patient's past medical history,  medications and allergies.   Current Outpatient Prescriptions on File Prior to Visit  Medication Sig Dispense Refill  . amLODipine (NORVASC) 10 MG tablet TAKE ONE TABLET BY MOUTH DAILY 90 tablet 1  . atenolol (TENORMIN) 50 MG tablet TAKE ONE TABLET BY MOUTH DAILY 90 tablet 1  . ezetimibe (ZETIA) 10 MG tablet Take 1 tablet (10 mg total) by mouth daily. 30 tablet 3  . fenofibrate 160 MG tablet TAKE ONE TABLET BY MOUTH DAILY 90 tablet 1  . fish oil-omega-3 fatty acids 1000 MG capsule Take 1 g by mouth 2 (two) times daily.    [name] glucose blood (ACCU-CHEK AVIVA) test strip Check sugars three times daily 306 each 2  . Insulin Syringe-Needle U-100 (INSULIN SYRINGE .3CC/31GX5/16") 31G X 5/16" 0.3 ML MISC Use for insulin administration three times daily. 100 each 6  . losartan (COZAAR) 100 MG tablet TAKE ONE TABLET BY MOUTH DAILY 90 tablet 1  . Multiple Vitamins-Minerals (MULTIVITAMIN WITH MINERALS) tablet Take 1 tablet by mouth daily.    . Probiotic Product (PROBIOTIC MATURE ADULT) CAPS Take 1 capsule by mouth daily.    [name] zoster vaccine live, PF, (ZOSTAVAX) 74944 UNT/0.65ML injection Inject 19,400 Units into the skin once. (Patient not taking: Reported on 11/28/2014) 1 each 0   No current facility-administered medications on file prior to visit.   Allergies  Allergen Reactions  . Atorvastatin     REACTION: Memory problems  . Codeine Sulfate   . Metformin     REACTION: GI problems     Review of Systems- [ x ]  Complains of    [  ]  denies [ x ] Recent weight change [  ]  Fatigue [  ] polydipsia [  x] polyuria>>drinking more water recently [  ]  nocturia [  ]  vision difficulty [  ] chest pain [  ] shortness of breath [  ] leg swelling [  ] cough [  ] nausea/vomiting [  ] diarrhea [  ] constipation [  ] abdominal pain [  ]  tingling/numbness in extremities [  ]  concern with feet ( wounds/sores)    PE: BP 124/72 mmHg  Pulse 101  Resp 14  Ht 5' 7.5" (1.715 m)   Wt 208 lb 12 oz (94.688 kg)  BMI 32.19 kg/m2  SpO2 97% Wt Readings from Last 3 Encounters:  11/28/14 208 lb 12 oz (94.688 kg)  10/17/14 210 lb 12 oz (95.596 kg)  09/12/14 210 lb 4 oz (95.369 kg)   Exam: deferred   ASSESSMENT AND PLAN: Problem List Items Addressed This Visit      Cardiovascular and Mediastinum   Essential hypertension    BP at target today on current regimen. Last urine MA negative November 2015.  Endocrine   DM (diabetes mellitus), type 2, uncontrolled, with renal complications - Primary    Recent A1c and sugars are not at goal,but sugars are getting better.   Check sugars 3- 4 x daily.   Change U-500 to 15 units with BF, 13 units lunch and 15 units with supper (continue to take it halfhour prior to meals).   She will report back if she starts to have problems with her sugars or starts having low sugars, rather than self adjusting her insulin.  Now that the U500 Claiborne Rigg is available, the patient would like to try this- she will finish out her current U500 vial and then change to following doses on U500 Kwikpen( 75 units BF,65 units lunch and 75 units supper).  Continue working on diet/exercise.                 Relevant Medications   insulin regular human CONCENTRATED (HUMULIN R) 500 UNIT/ML SOLN injection     Genitourinary   CKD stage 3 due to type 2 diabetes mellitus    Recent GFR stable and c/w Stage  3 CKD. Follows with Dr Holley Raring.              Relevant Medications   insulin regular human CONCENTRATED (HUMULIN R) 500 UNIT/ML SOLN injection     Other   Hypercholesterolemia with hypertriglyceridemia    Lipids are not controlled. Patient previously refused statin therapy and recalls not tolerating Atorvastatin in the past.  TGs are improving on fenofibrate. I had recommended adding zetia at last visit, but patient concerned about whether it would be truly beneficial for her wrt plaque reduction, since she found a  reference to the contrary. She wishes to also discuss this further with Dr Derrel Nip prior to starting.                 - Return to clinic in 6 weeks with sugar log/meter.  Taunya Goral [facility] 11/28/2014 9:24 AM

## 2014-11-28 NOTE — Patient Instructions (Signed)
Check sugars 3 times daily ( fasting and premeal readings at alternating times, or at bedtime).  Record them in a sugar log and bring log and meter to next appointment.   Change Humulin U500 vial to pen form Dose on vial - 15 units BF/13 units with lunch and 15 units with supper.  Dose on U500 pen- 75 unitsBF/65 units lunch/75 units supper.   Notify if start to have lows.  Please come back for a follow-up appointment in 6 weeks

## 2014-11-28 NOTE — Assessment & Plan Note (Signed)
Lipids are not controlled. Patient previously refused statin therapy and recalls not tolerating Atorvastatin in the past.  TGs are improving on fenofibrate. I had recommended adding zetia at last visit, but patient concerned about whether it would be truly beneficial for her wrt plaque reduction, since she found a reference to the contrary. She wishes to also discuss this further with Dr Derrel Nip prior to starting.

## 2014-11-28 NOTE — Assessment & Plan Note (Signed)
BP at target today on current regimen. Last urine MA negative November 2015.

## 2014-11-28 NOTE — Assessment & Plan Note (Signed)
Recent A1c and sugars are not at goal,but sugars are getting better.   Check sugars 3- 4 x daily.   Change U-500 to 15 units with BF, 13 units lunch and 15 units with supper (continue to take it halfhour prior to meals).   She will report back if she starts to have problems with her sugars or starts having low sugars, rather than self adjusting her insulin.  Now that the U500 Claiborne Rigg is available, the patient would like to try this- she will finish out her current U500 vial and then change to following doses on U500 Kwikpen( 75 units BF,65 units lunch and 75 units supper).  Continue working on diet/exercise.

## 2014-11-28 NOTE — Assessment & Plan Note (Signed)
Recent GFR stable and c/w Stage  3 CKD. Follows with Dr Holley Raring.

## 2014-12-22 NOTE — Op Note (Signed)
PATIENT NAME:  Pamela Harris, Pamela Harris MR#:  [id number] DATE OF BIRTH:  1956-07-20  DATE OF PROCEDURE:  11/12/2014  PREOPERATIVE DIAGNOSIS:  Nuclear sclerotic cataract of the left eye.   POSTOPERATIVE DIAGNOSIS:  Nuclear sclerotic cataract of the left eye.   OPERATIVE PROCEDURE:  Cataract extraction by phacoemulsification with implant of intraocular lens to left eye.   SURGEON:  Birder Robson, MD.   ANESTHESIA:  1. Managed anesthesia care.  2. Topical tetracaine drops followed by 2% Xylocaine jelly applied in the preoperative holding area.   COMPLICATIONS:  None.   TECHNIQUE:   Stop and chop  DESCRIPTION OF PROCEDURE:  The patient was examined and consented in the preoperative holding area where the aforementioned topical anesthesia was applied to the left eye and then brought back to the operating room where the left eye was prepped and draped in the usual sterile ophthalmic fashion and a lid speculum was placed. A paracentesis was created with the side port blade and the anterior chamber was filled with viscoelastic. A near clear corneal incision was performed with the steel keratome. A continuous curvilinear capsulorrhexis was performed with a cystotome followed by the capsulorrhexis forceps. Hydrodissection and hydrodelineation were carried out with BSS on a blunt cannula. The lens was removed in a stop and chop technique and the remaining cortical material was removed with the irrigation-aspiration handpiece. The capsular bag was inflated with viscoelastic and the Alcon SN60WF 19.0-diopter lens, serial number [id number] was placed in the capsular bag without complication. The remaining viscoelastic was removed from the eye with the irrigation-aspiration handpiece. The wounds were hydrated. The anterior chamber was flushed with Miostat and the eye was inflated to physiologic pressure. 0.1 mL of cefuroxime concentration 10 mg/mL was placed in the anterior chamber. The wounds were found to be  water tight. The eye was dressed with Vigamox. The patient was given protective glasses to wear throughout the day and a shield with which to sleep tonight. The patient was also given drops with which to begin a drop regimen today and will follow-up with me in one day.    ____________________________ Livingston Diones. Agata Lucente, MD wlp:bm D: 11/12/2014 21:21:36 ET T: 11/13/2014 07:16:23 ET JOB#: [id number]  cc: Hilari Wethington L. Elese Rane, MD, <Dictator> Livingston Diones Knox Cervi MD ELECTRONICALLY SIGNED 11/13/2014 12:04

## 2015-01-06 ENCOUNTER — Encounter: Payer: Self-pay | Admitting: Internal Medicine

## 2015-01-06 ENCOUNTER — Ambulatory Visit (INDEPENDENT_AMBULATORY_CARE_PROVIDER_SITE_OTHER): Payer: BLUE CROSS/BLUE SHIELD | Admitting: Internal Medicine

## 2015-01-06 VITALS — BP 126/68 | HR 78 | Temp 98.5°F | Resp 14 | Ht 67.25 in | Wt 206.5 lb

## 2015-01-06 DIAGNOSIS — N183 Chronic kidney disease, stage 3 unspecified: Secondary | ICD-10-CM

## 2015-01-06 DIAGNOSIS — Z789 Other specified health status: Secondary | ICD-10-CM

## 2015-01-06 DIAGNOSIS — Z1239 Encounter for other screening for malignant neoplasm of breast: Secondary | ICD-10-CM

## 2015-01-06 DIAGNOSIS — Z23 Encounter for immunization: Secondary | ICD-10-CM | POA: Diagnosis not present

## 2015-01-06 DIAGNOSIS — Z0000 Encounter for general adult medical examination without abnormal findings: Secondary | ICD-10-CM

## 2015-01-06 DIAGNOSIS — E1122 Type 2 diabetes mellitus with diabetic chronic kidney disease: Secondary | ICD-10-CM

## 2015-01-06 MED ORDER — ATENOLOL 50 MG PO TABS: 50.0000 mg | ORAL_TABLET | Freq: Every day | ORAL | Status: DC

## 2015-01-06 MED ORDER — AMLODIPINE BESYLATE 10 MG PO TABS: 10.0000 mg | ORAL_TABLET | Freq: Every day | ORAL | Status: DC

## 2015-01-06 MED ORDER — FENOFIBRATE 160 MG PO TABS: 160.0000 mg | ORAL_TABLET | Freq: Every day | ORAL | Status: DC

## 2015-01-06 NOTE — Progress Notes (Signed)
Patient ID: Pamela Harris, female   DOB: 06-18-1956, 59 y.o.   MRN: [id number]   Subjective:     Pamela Harris is a 59 y.o. female and is here for a comprehensive physical exam. The patient reports no problems.   Annual exam .  Dr Irving Burton her diabetes , next A1c due in 2 weeks. BS are improved with U500 insulin but not at goal.   Had Cataract surgery on the  left eye,  March 2016.  Porfilio .  Has retinopathy ,  Every 6 months with retina specialist.    Needs 3 d mammogram     History   Social History  . Marital Status: Married    Spouse Name: N/A  . Number of Children: N/A  . Years of Education: N/A   Occupational History  . Not on file.   Social History Main Topics  . Smoking status: Never Smoker   . Smokeless tobacco: Never Used  . Alcohol Use: No  . Drug Use: No  . Sexual Activity: Not on file   Other Topics Concern  . Not on file   Social History Narrative   Health Maintenance  Topic Date Due  . HIV Screening  11/13/1972  . INFLUENZA VACCINE  03/24/2015  . HEMOGLOBIN A1C  04/17/2015  . URINE MICROALBUMIN  07/03/2015  . FOOT EXAM  07/17/2015  . OPHTHALMOLOGY EXAM  10/15/2015  . MAMMOGRAM  10/31/2015  . PAP SMEAR  12/28/2015  . PNEUMOCOCCAL POLYSACCHARIDE VACCINE (2) 10/12/2018  . COLONOSCOPY  06/04/2019  . TETANUS/TDAP  06/27/2022    The following portions of the patient's history were reviewed and updated as appropriate: allergies, current medications, past family history, past medical history, past social history, past surgical history and problem list.  Review of Systems  Patient denies headache, fevers, malaise, unintentional weight loss, skin rash, eye pain, sinus congestion and sinus pain, sore throat, dysphagia,  hemoptysis , cough, dyspnea, wheezing, chest pain, palpitations, orthopnea, edema, abdominal pain, nausea, melena, diarrhea, constipation, flank pain, dysuria, hematuria, urinary  Frequency, nocturia, numbness, tingling,  seizures,  Focal weakness, Loss of consciousness,  Tremor, insomnia, depression, anxiety, and suicidal ideation.     Objective:  BP 126/68 mmHg  Pulse 78  Temp(Src) 98.5 F (36.9 C) (Oral)  Resp 14  Ht 5' 7.25" (1.708 m)  Wt 206 lb 8 oz (93.668 kg)  BMI 32.11 kg/m2  SpO2 99%   General appearance: alert, cooperative and appears stated age Head: Normocephalic, without obvious abnormality, atraumatic Eyes: conjunctivae/corneas clear. PERRL, EOM's intact. Fundi benign. Ears: normal TM's and external ear canals both ears Nose: Nares normal. Septum midline. Mucosa normal. No drainage or sinus tenderness. Throat: lips, mucosa, and tongue normal; teeth and gums normal Neck: no adenopathy, no carotid bruit, no JVD, supple, symmetrical, trachea midline and thyroid not enlarged, symmetric, no tenderness/mass/nodules Lungs: clear to auscultation bilaterally Breasts: normal appearance, no masses or tenderness Heart: regular rate and rhythm, S1, S2 normal, no murmur, click, rub or gallop Abdomen: soft, non-tender; bowel sounds normal; no masses,  no organomegaly Extremities: extremities normal, atraumatic, no cyanosis or edema Pulses: 2+ and symmetric Skin: Skin color, texture, turgor normal. No rashes or lesions Neurologic: Alert and oriented X 3, normal strength and tone. Normal symmetric reflexes. Normal coordination and gait.    Assessment and Plan:    Encounter for preventive health examination Annual wellness  exam was done as well as a comprehensive physical exam and management of acute and chronic conditions .  During the course of the visit the patient was educated and counseled about appropriate screening and preventive services including :  lipid analysis with projected  10 year  risk for CAD , nutrition counseling, colorectal cancer screening, and recommended immunizations.  Printed recommendations for health maintenance screenings was given.     CKD stage 3 due to type 2 diabetes  mellitus Stable.  Prenvar given today   Statin intolerance She has documented intolerances to statin s and Zetia ,  Continue fenofibrate. Lab Results  Component Value Date   CHOL 230* 10/17/2014   HDL 39.10 10/17/2014   LDLCALC 159* 07/02/2014   LDLDIRECT 152.0 10/17/2014   TRIG 215.0* 10/17/2014   CHOLHDL 6 10/17/2014       Updated Medication List Outpatient Encounter Prescriptions as of 01/06/2015  Medication Sig  . amLODipine (NORVASC) 10 MG tablet Take 1 tablet (10 mg total) by mouth daily.  [name] atenolol (TENORMIN) 50 MG tablet Take 1 tablet (50 mg total) by mouth daily.  . fenofibrate 160 MG tablet Take 1 tablet (160 mg total) by mouth daily.  . fish oil-omega-3 fatty acids 1000 MG capsule Take 1 g by mouth 2 (two) times daily.  [name] glucose blood (ACCU-CHEK AVIVA) test strip Check sugars three times daily  . insulin regular human CONCENTRATED (HUMULIN R) 500 UNIT/ML SOLN injection Give U 500 kwik pen Patient uses 75 units BF/65 units with lunch and 75 units with supper on the pen. Max daily dose of U500 is 215 units.  . Insulin Syringe-Needle U-100 (INSULIN SYRINGE .3CC/31GX5/16") 31G X 5/16" 0.3 ML MISC Use for insulin administration three times daily.  [name] losartan (COZAAR) 100 MG tablet TAKE ONE TABLET BY MOUTH DAILY  . Multiple Vitamins-Minerals (MULTIVITAMIN WITH MINERALS) tablet Take 1 tablet by mouth daily.  . Probiotic Product (PROBIOTIC MATURE ADULT) CAPS Take 1 capsule by mouth daily.  . [DISCONTINUED] amLODipine (NORVASC) 10 MG tablet TAKE ONE TABLET BY MOUTH DAILY  . [DISCONTINUED] atenolol (TENORMIN) 50 MG tablet TAKE ONE TABLET BY MOUTH DAILY  . [DISCONTINUED] fenofibrate 160 MG tablet TAKE ONE TABLET BY MOUTH DAILY  . zoster vaccine live, PF, (ZOSTAVAX) 77824 UNT/0.65ML injection Inject 19,400 Units into the skin once. (Patient not taking: Reported on 11/28/2014)  . [DISCONTINUED] ezetimibe (ZETIA) 10 MG tablet Take 1 tablet (10 mg total) by mouth daily. (Patient not  taking: Reported on 01/06/2015)   No facility-administered encounter medications on file as of 01/06/2015.

## 2015-01-06 NOTE — Progress Notes (Signed)
Pre-visit discussion using our clinic review tool. No additional management support is needed unless otherwise documented below in the visit note.  

## 2015-01-06 NOTE — Patient Instructions (Addendum)
Please return for fasting labs on or after May 26th   You received the Prevnar vaccine today   Health Maintenance Adopting a healthy lifestyle and getting preventive care can go a long way to promote health and wellness. Talk with your health care provider about what schedule of regular examinations is right for you. This is a good chance for you to check in with your provider about disease prevention and staying healthy. In between checkups, there are plenty of things you can do on your own. Experts have done a lot of research about which lifestyle changes and preventive measures are most likely to keep you healthy. Ask your health care provider for more information. WEIGHT AND DIET  Eat a healthy diet  Be sure to include plenty of vegetables, fruits, low-fat dairy products, and lean protein.  Do not eat a lot of foods high in solid fats, added sugars, or salt.  Get regular exercise. This is one of the most important things you can do for your health.  Most adults should exercise for at least 150 minutes each week. The exercise should increase your heart rate and make you sweat (moderate-intensity exercise).  Most adults should also do strengthening exercises at least twice a week. This is in addition to the moderate-intensity exercise.  Maintain a healthy weight  Body mass index (BMI) is a measurement that can be used to identify possible weight problems. It estimates body fat based on height and weight. Your health care provider can help determine your BMI and help you achieve or maintain a healthy weight.  For females 42 years of age and older:   A BMI below 18.5 is considered underweight.  A BMI of 18.5 to 24.9 is normal.  A BMI of 25 to 29.9 is considered overweight.  A BMI of 30 and above is considered obese.  Watch levels of cholesterol and blood lipids  You should start having your blood tested for lipids and cholesterol at 57 years of age, then have this test every 5  years.  You may need to have your cholesterol levels checked more often if:  Your lipid or cholesterol levels are high.  You are older than 57 years of age.  You are at high risk for heart disease.  CANCER SCREENING   Lung Cancer  Lung cancer screening is recommended for adults 6-96 years old who are at high risk for lung cancer because of a history of smoking.  A yearly low-dose CT scan of the lungs is recommended for people who:  Currently smoke.  Have quit within the past 15 years.  Have at least a 30-pack-year history of smoking. A pack year is smoking an average of one pack of cigarettes a day for 1 year.  Yearly screening should continue until it has been 15 years since you quit.  Yearly screening should stop if you develop a health problem that would prevent you from having lung cancer treatment.  Breast Cancer  Practice breast self-awareness. This means understanding how your breasts normally appear and feel.  It also means doing regular breast self-exams. Let your health care provider know about any changes, no matter how small.  If you are in your 20s or 30s, you should have a clinical breast exam (CBE) by a health care provider every 1-3 years as part of a regular health exam.  If you are 15 or older, have a CBE every year. Also consider having a breast X-ray (mammogram) every year.  If you  have a family history of breast cancer, talk to your health care provider about genetic screening.  If you are at high risk for breast cancer, talk to your health care provider about having an MRI and a mammogram every year.  Breast cancer gene (BRCA) assessment is recommended for women who have family members with BRCA-related cancers. BRCA-related cancers include:  Breast.  Ovarian.  Tubal.  Peritoneal cancers.  Results of the assessment will determine the need for genetic counseling and BRCA1 and BRCA2 testing. Cervical Cancer Routine pelvic examinations to  screen for cervical cancer are no longer recommended for nonpregnant women who are considered low risk for cancer of the pelvic organs (ovaries, uterus, and vagina) and who do not have symptoms. A pelvic examination may be necessary if you have symptoms including those associated with pelvic infections. Ask your health care provider if a screening pelvic exam is right for you.   The Pap test is the screening test for cervical cancer for women who are considered at risk.  If you had a hysterectomy for a problem that was not cancer or a condition that could lead to cancer, then you no longer need Pap tests.  If you are older than 65 years, and you have had normal Pap tests for the past 10 years, you no longer need to have Pap tests.  If you have had past treatment for cervical cancer or a condition that could lead to cancer, you need Pap tests and screening for cancer for at least 20 years after your treatment.  If you no longer get a Pap test, assess your risk factors if they change (such as having a new sexual partner). This can affect whether you should start being screened again.  Some women have medical problems that increase their chance of getting cervical cancer. If this is the case for you, your health care provider may recommend more frequent screening and Pap tests.  The human papillomavirus (HPV) test is another test that may be used for cervical cancer screening. The HPV test looks for the virus that can cause cell changes in the cervix. The cells collected during the Pap test can be tested for HPV.  The HPV test can be used to screen women 66 years of age and older. Getting tested for HPV can extend the interval between normal Pap tests from three to five years.  An HPV test also should be used to screen women of any age who have unclear Pap test results.  After 57 years of age, women should have HPV testing as often as Pap tests.  Colorectal Cancer  This type of cancer can be  detected and often prevented.  Routine colorectal cancer screening usually begins at 57 years of age and continues through 57 years of age.  Your health care provider may recommend screening at an earlier age if you have risk factors for colon cancer.  Your health care provider may also recommend using home test kits to check for hidden blood in the stool.  A small camera at the end of a tube can be used to examine your colon directly (sigmoidoscopy or colonoscopy). This is done to check for the earliest forms of colorectal cancer.  Routine screening usually begins at age 25.  Direct examination of the colon should be repeated every 5-10 years through 57 years of age. However, you may need to be screened more often if early forms of precancerous polyps or small growths are found. Skin Cancer  Check  your skin from head to toe regularly.  Tell your health care provider about any new moles or changes in moles, especially if there is a change in a mole's shape or color.  Also tell your health care provider if you have a mole that is larger than the size of a pencil eraser.  Always use sunscreen. Apply sunscreen liberally and repeatedly throughout the day.  Protect yourself by wearing long sleeves, pants, a wide-brimmed hat, and sunglasses whenever you are outside. HEART DISEASE, DIABETES, AND HIGH BLOOD PRESSURE   Have your blood pressure checked at least every 1-2 years. High blood pressure causes heart disease and increases the risk of stroke.  If you are between 34 years and 60 years old, ask your health care provider if you should take aspirin to prevent strokes.  Have regular diabetes screenings. This involves taking a blood sample to check your fasting blood sugar level.  If you are at a normal weight and have a low risk for diabetes, have this test once every three years after 57 years of age.  If you are overweight and have a high risk for diabetes, consider being tested at a  younger age or more often. PREVENTING INFECTION  Hepatitis B  If you have a higher risk for hepatitis B, you should be screened for this virus. You are considered at high risk for hepatitis B if:  You were born in a country where hepatitis B is common. Ask your health care provider which countries are considered high risk.  Your parents were born in a high-risk country, and you have not been immunized against hepatitis B (hepatitis B vaccine).  You have HIV or AIDS.  You use needles to inject street drugs.  You live with someone who has hepatitis B.  You have had sex with someone who has hepatitis B.  You get hemodialysis treatment.  You take certain medicines for conditions, including cancer, organ transplantation, and autoimmune conditions. Hepatitis C  Blood testing is recommended for:  Everyone born from [birth year] through 1963.  Anyone with known risk factors for hepatitis C. Sexually transmitted infections (STIs)  You should be screened for sexually transmitted infections (STIs) including gonorrhea and chlamydia if:  You are sexually active and are younger than 57 years of age.  You are older than 57 years of age and your health care provider tells you that you are at risk for this type of infection.  Your sexual activity has changed since you were last screened and you are at an increased risk for chlamydia or gonorrhea. Ask your health care provider if you are at risk.  If you do not have HIV, but are at risk, it may be recommended that you take a prescription medicine daily to prevent HIV infection. This is called pre-exposure prophylaxis (PrEP). You are considered at risk if:  You are sexually active and do not regularly use condoms or know the HIV status of your partner(s).  You take drugs by injection.  You are sexually active with a partner who has HIV. Talk with your health care provider about whether you are at high risk of being infected with HIV. If you choose  to begin PrEP, you should first be tested for HIV. You should then be tested every 3 months for as long as you are taking PrEP.  PREGNANCY   If you are premenopausal and you may become pregnant, ask your health care provider about preconception counseling.  If you may become pregnant, take  400 to 800 micrograms (mcg) of folic acid every day.  If you want to prevent pregnancy, talk to your health care provider about birth control (contraception). OSTEOPOROSIS AND MENOPAUSE   Osteoporosis is a disease in which the bones lose minerals and strength with aging. This can result in serious bone fractures. Your risk for osteoporosis can be identified using a bone density scan.  If you are 23 years of age or older, or if you are at risk for osteoporosis and fractures, ask your health care provider if you should be screened.  Ask your health care provider whether you should take a calcium or vitamin D supplement to lower your risk for osteoporosis.  Menopause may have certain physical symptoms and risks.  Hormone replacement therapy may reduce some of these symptoms and risks. Talk to your health care provider about whether hormone replacement therapy is right for you.  HOME CARE INSTRUCTIONS   Schedule regular health, dental, and eye exams.  Stay current with your immunizations.   Do not use any tobacco products including cigarettes, chewing tobacco, or electronic cigarettes.  If you are pregnant, do not drink alcohol.  If you are breastfeeding, limit how much and how often you drink alcohol.  Limit alcohol intake to no more than 1 drink per day for nonpregnant women. One drink equals 12 ounces of beer, 5 ounces of wine, or 1 ounces of hard liquor.  Do not use street drugs.  Do not share needles.  Ask your health care provider for help if you need support or information about quitting drugs.  Tell your health care provider if you often feel depressed.  Tell your health care  provider if you have ever been abused or do not feel safe at home. Document Released: 02/22/2011 Document Revised: 12/24/2013 Document Reviewed: 07/11/2013 [facility] Patient Information 2015 [place], [place]. This information is not intended to replace advice given to you by your health care provider. Make sure you discuss any questions you have with your health care provider.

## 2015-01-07 ENCOUNTER — Encounter: Payer: Self-pay | Admitting: Internal Medicine

## 2015-01-07 NOTE — Assessment & Plan Note (Signed)
Annual wellness  exam was done as well as a comprehensive physical exam and management of acute and chronic conditions .  During the course of the visit the patient was educated and counseled about appropriate screening and preventive services including :  lipid analysis with projected  10 year  risk for CAD , nutrition counseling, colorectal cancer screening, and recommended immunizations.  Printed recommendations for health maintenance screenings was given.

## 2015-01-07 NOTE — Assessment & Plan Note (Signed)
She has documented intolerances to statin s and Zetia ,  Continue fenofibrate. Lab Results  Component Value Date   CHOL 230* 10/17/2014   HDL 39.10 10/17/2014   LDLCALC 159* 07/02/2014   LDLDIRECT 152.0 10/17/2014   TRIG 215.0* 10/17/2014   CHOLHDL 6 10/17/2014

## 2015-01-07 NOTE — Assessment & Plan Note (Signed)
Stable.  Prenvar given today

## 2015-01-09 ENCOUNTER — Ambulatory Visit (INDEPENDENT_AMBULATORY_CARE_PROVIDER_SITE_OTHER): Payer: BLUE CROSS/BLUE SHIELD | Admitting: Endocrinology

## 2015-01-09 ENCOUNTER — Encounter: Payer: Self-pay | Admitting: Endocrinology

## 2015-01-09 VITALS — BP 122/64 | HR 90 | Resp 14 | Ht 67.25 in | Wt 206.0 lb

## 2015-01-09 DIAGNOSIS — I10 Essential (primary) hypertension: Secondary | ICD-10-CM | POA: Diagnosis not present

## 2015-01-09 DIAGNOSIS — E1129 Type 2 diabetes mellitus with other diabetic kidney complication: Secondary | ICD-10-CM | POA: Diagnosis not present

## 2015-01-09 DIAGNOSIS — E1122 Type 2 diabetes mellitus with diabetic chronic kidney disease: Secondary | ICD-10-CM | POA: Diagnosis not present

## 2015-01-09 DIAGNOSIS — N183 Chronic kidney disease, stage 3 unspecified: Secondary | ICD-10-CM

## 2015-01-09 DIAGNOSIS — E1165 Type 2 diabetes mellitus with hyperglycemia: Secondary | ICD-10-CM

## 2015-01-09 DIAGNOSIS — E782 Mixed hyperlipidemia: Secondary | ICD-10-CM

## 2015-01-09 DIAGNOSIS — E669 Obesity, unspecified: Secondary | ICD-10-CM | POA: Diagnosis not present

## 2015-01-09 DIAGNOSIS — IMO0002 Reserved for concepts with insufficient information to code with codable children: Secondary | ICD-10-CM

## 2015-01-09 NOTE — Assessment & Plan Note (Signed)
Recent A1c and sugars are not at goal,but sugars are getting better. Update A1c next week, after the 90 day mark  Check sugars 3- 4 x daily.     She will take no more than 1/3 dose of scheduled insulin if going in for fasting labs, provided her sugars are >150.  Change U500 Kwikpen to ( 70 units BF,65 units lunch and 80 units supper). Take 5 units less if just eating bowl of veggies for lunch time Continue working on diet/exercise.

## 2015-01-09 NOTE — Progress Notes (Signed)
Reason for visit-  Pamela Harris is a 59 y.o.-year-old female, here for management of Type 2 diabetes, uncontrolled, with complications ( stage 3 CKD, retinopathy). Last visit 6 weeks ago.    HPI- Patient has been diagnosed with diabetes in 1996. Recalls being initially on lifestyle modifications.  Tried  Metformin, Actos/Avandia, Byetta prior. she has  been on insulin for majority of the time after the DKA episode in the 1990s.   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 * also tried on other insulins in the past, 75/25 , 70/30 and now most recently is on the combination below.  *Recalls Lantus was not covered under her plan several years ago.  * switched from NPH and Humalog ss qac to Humulin U- 500 in Nov 2015  Pt is currently on a regimen of: - Humulin U 500 at 15 units BF, 13 units lunch and 15 units supper>>U500 Kwikpen taking ( 65 units BF,65 units lunch and 75 units supper).  * takes it before meals at 7am, 1pm, and 7 pm>>this is working better with her eating schedule *occasionally taken after the meals during weekends *adjusted the BF dose down by herself, as has difficulty readings numbers occasionally and may not have read my instructions right the last time   Last hemoglobin A1c was: Lab Results  Component Value Date   HGBA1C 8.3* 10/17/2014   HGBA1C 8.5* 07/02/2014   HGBA1C 7.2* 01/11/2014     Pt checks her sugars 2-4 x a day . Uses accuchek compact plus glucometer. By meter download/meter review they are:   PREMEAL Breakfast Lunch Dinner Bedtime Overall  Glucose range: 100-228 136-248 78-175    Mean/median:        POST-MEAL PC Breakfast PC Lunch PC Dinner  Glucose range:     Mean/median:       Hypoglycemia-  Some lows recently prior to supper cutting back on snacking and  portion sizes, lowest is in the 78- thinks that it was associated with just eating a bowl of veggies   Dietary habits- eats three times daily. Tries to limit carbs, sweetened beverages, sodas, desserts.   Exercise-  Has equipment at home-TD, bike and elliptical. Riding the bike 15 minutes daily since last time. Doing some more yard work  [employer] -  Wt Readings from Last 3 Encounters:  01/09/15 206 lb (93.441 kg)  01/06/15 206 lb 8 oz (93.668 kg)  11/28/14 208 lb 12 oz (94.688 kg)    Diabetes Complications-  Nephropathy- Yes , stage 3  CKD, last BUN/creatinine- Lab Results  Component Value Date   BUN 25* 10/17/2014   CREATININE 1.08 10/17/2014   Lab Results  Component Value Date   GFR 55.59* 10/17/2014   MICRALBCREAT 0.5 07/02/2014     Retinopathy- Yes, Last DEE was 2015, in 2008 hemorrhages right eye- had laser surgery, told about retinopathy then- now goes to [facility] 2x yearly Retinal surgery in right eye, cataract surgery right eye, cataract surgery left eye 2 months ago  Neuropathy- no numbness and tingling in her feet. No known neuropathy. Starting to notice occasional burning of feet. Reports occasional numbness in left great toe since her ankle surgery.  Associated history - No CAD . No prior stroke. No hypothyroidism. her last TSH was  Lab Results  Component Value Date  TSH 2.60 10/12/2013    Hyperlipidemia-  her last set of lipids were- Currently on fish oil and fenofibrate, and was supposed to start zetia ( started Feb 2016) but not sure that it will help her with her plaque reduction. Tolerating well. Allergic to Atorvastatin due to memory problems and muscle weakness.  Also recalls trying 2 other statins (lovastatin and some other) and did not tolerate it Lab Results  Component Value Date   CHOL 230* 10/17/2014   HDL 39.10 10/17/2014   LDLCALC 159* 07/02/2014   LDLDIRECT 152.0 10/17/2014   TRIG 215.0* 10/17/2014   CHOLHDL 6 10/17/2014  Requests  her Vitamin D levels to be checked. Was taking high dose supplementation in the past, now taking 1000-2000 units daily  Blood Pressure/HTN- Patient's blood pressure is well controlled today on current regimen that includes ARB ( Cozaar).   I have reviewed the patient's past medical history,  medications and allergies.   Current Outpatient Prescriptions on File Prior to Visit  Medication Sig Dispense Refill  . amLODipine (NORVASC) 10 MG tablet Take 1 tablet (10 mg total) by mouth daily. 90 tablet 1  . atenolol (TENORMIN) 50 MG tablet Take 1 tablet (50 mg total) by mouth daily. 90 tablet 1  . fenofibrate 160 MG tablet Take 1 tablet (160 mg total) by mouth daily. 90 tablet 1  . fish oil-omega-3 fatty acids 1000 MG capsule Take 1 g by mouth 2 (two) times daily.    [name] glucose blood (ACCU-CHEK AVIVA) test strip Check sugars three times daily 306 each 2  . insulin regular human CONCENTRATED (HUMULIN R) 500 UNIT/ML SOLN injection Give U 500 kwik pen Patient uses 75 units BF/65 units with lunch and 75 units with supper on the pen. Max daily dose of U500 is 215 units. 20 mL 2  . Insulin Syringe-Needle U-100 (INSULIN SYRINGE .3CC/31GX5/16") 31G X 5/16" 0.3 ML MISC Use for insulin administration three times daily. 100 each 6  . losartan (COZAAR) 100 MG tablet TAKE ONE TABLET BY MOUTH DAILY 90 tablet 1  . Multiple Vitamins-Minerals (MULTIVITAMIN WITH MINERALS) tablet Take 1 tablet by mouth daily.    . Probiotic Product (PROBIOTIC MATURE ADULT) CAPS Take 1 capsule by mouth daily.    [name] zoster vaccine live, PF, (ZOSTAVAX) 33545 UNT/0.65ML injection Inject 19,400 Units into the skin once. 1 each 0   No current facility-administered medications on file prior to visit.   Allergies  Allergen Reactions  . Atorvastatin     REACTION: Memory problems  . Codeine Sulfate   . Metformin     REACTION: GI problems  . Zetia [Ezetimibe]     myalgias     Review of Systems- [ x ]  Complains of    [  ]  denies [  ]  Recent weight change [  ]  Fatigue  [x  ]  vision difficulty-baseline [  ] chest pain [  ] shortness of breath [  ] leg swelling [  ] cough [  ] nausea/vomiting [  ] diarrhea [  ] constipation [  ] abdominal pain [  ]  tingling/numbness in extremities [  ]  concern with feet ( wounds/sores)    PE: BP 122/64 mmHg  Pulse 90  Resp 14  Ht 5' 7.25" (1.708 m)  Wt 206 lb (93.441 kg)  BMI 32.03 kg/m2  SpO2 97% Wt Readings from Last 3 Encounters:  01/09/15 206 lb (93.441 kg)  01/06/15 206 lb 8 oz (93.668 kg)  11/28/14 208 lb 12 oz (94.688 kg)   Exam: deferred   ASSESSMENT AND PLAN: Problem List Items Addressed This Visit      Cardiovascular and Mediastinum   Essential hypertension    BP at target today on current regimen. Last urine MA negative November 2015.                 Relevant Orders   Comprehensive metabolic panel   Hemoglobin A1c   Vit D  25 hydroxy (rtn osteoporosis monitoring)   Lipid panel     Endocrine   DM (diabetes mellitus), type 2, uncontrolled, with renal complications - Primary    Recent A1c and sugars are not at goal,but sugars are getting better. Update A1c next week, after the 90 day mark  Check sugars 3- 4 x daily.     She will take no more than 1/3 dose of scheduled insulin if going in for fasting labs, provided her sugars are >150.  Change U500 Kwikpen to ( 70 units BF,65 units lunch and 80 units supper). Take 5 units less if just eating bowl of veggies for lunch time Continue working on diet/exercise.                   Relevant Orders   Comprehensive metabolic panel   Hemoglobin A1c   Vit D  25 hydroxy (rtn osteoporosis monitoring)   Lipid panel     Genitourinary   CKD stage 3 due to type 2 diabetes mellitus    Recent GFR stable and c/w Stage  3 CKD. Follows with Dr Holley Raring.  Update next week              Relevant Orders   Comprehensive metabolic panel   Hemoglobin A1c   Vit D  25 hydroxy (rtn osteoporosis  monitoring)   Lipid panel     Other   Hypercholesterolemia with hypertriglyceridemia    Lipids are not controlled. Patient previously refused statin therapy and recalls not tolerating Atorvastatin in the past.  TGs are improving on fenofibrate. I had recommended adding zetia at last visits, but patient not willing. Update lipids this visit, and then perhaps she will reconsider starting medication.  She wishes to also discuss this further with Dr Derrel Nip prior to starting- she didn't do this last visit.              Obesity (BMI 30-39.9)    Encourage diet and exercise. Weight stable to downtrending.       Relevant Orders   Comprehensive metabolic panel   Hemoglobin A1c   Vit D  25 hydroxy (rtn osteoporosis monitoring)   Lipid panel        - Return to clinic in 6 weeks with sugar log/meter.  Trever Streater [facility] 01/09/2015 9:32 AM

## 2015-01-09 NOTE — Assessment & Plan Note (Signed)
Recent GFR stable and c/w Stage  3 CKD. Follows with Dr Holley Raring.  Update next week

## 2015-01-09 NOTE — Progress Notes (Signed)
Pre visit review using our clinic review tool, if applicable. No additional management support is needed unless otherwise documented below in the visit note. 

## 2015-01-09 NOTE — Assessment & Plan Note (Signed)
BP at target today on current regimen. Last urine MA negative November 2015.

## 2015-01-09 NOTE — Patient Instructions (Signed)
Change U 500 Kwikpen to 70 units BF, 65 units lunch and 80 units with supper.  Keep up with diet and exercise.  If eating veggie bowl for lunch then reduce dose by 5 units.   Labs after 01/15/15 Please come back for a follow-up appointment in 6 weeks

## 2015-01-09 NOTE — Assessment & Plan Note (Signed)
Lipids are not controlled. Patient previously refused statin therapy and recalls not tolerating Atorvastatin in the past.  TGs are improving on fenofibrate. I had recommended adding zetia at last visits, but patient not willing. Update lipids this visit, and then perhaps she will reconsider starting medication.  She wishes to also discuss this further with Dr Derrel Nip prior to starting- she didn't do this last visit.

## 2015-01-09 NOTE — Assessment & Plan Note (Signed)
Encourage diet and exercise. Weight stable to downtrending.

## 2015-01-16 ENCOUNTER — Other Ambulatory Visit (INDEPENDENT_AMBULATORY_CARE_PROVIDER_SITE_OTHER): Payer: BLUE CROSS/BLUE SHIELD

## 2015-01-16 DIAGNOSIS — E1165 Type 2 diabetes mellitus with hyperglycemia: Secondary | ICD-10-CM

## 2015-01-16 DIAGNOSIS — E1129 Type 2 diabetes mellitus with other diabetic kidney complication: Secondary | ICD-10-CM

## 2015-01-16 DIAGNOSIS — E1122 Type 2 diabetes mellitus with diabetic chronic kidney disease: Secondary | ICD-10-CM | POA: Diagnosis not present

## 2015-01-16 DIAGNOSIS — N183 Chronic kidney disease, stage 3 unspecified: Secondary | ICD-10-CM

## 2015-01-16 DIAGNOSIS — I10 Essential (primary) hypertension: Secondary | ICD-10-CM

## 2015-01-16 DIAGNOSIS — IMO0002 Reserved for concepts with insufficient information to code with codable children: Secondary | ICD-10-CM

## 2015-01-16 DIAGNOSIS — E669 Obesity, unspecified: Secondary | ICD-10-CM

## 2015-01-16 LAB — COMPREHENSIVE METABOLIC PANEL
ALBUMIN: 4 g/dL (ref 3.5–5.2)
ALT: 15 U/L (ref 0–35)
AST: 14 U/L (ref 0–37)
Alkaline Phosphatase: 49 U/L (ref 39–117)
BUN: 24 mg/dL — AB (ref 6–23)
CO2: 26 mEq/L (ref 19–32)
Calcium: 9.2 mg/dL (ref 8.4–10.5)
Chloride: 106 mEq/L (ref 96–112)
Creatinine, Ser: 1.12 mg/dL (ref 0.40–1.20)
GFR: 53.26 mL/min — AB (ref >60.00)
GLUCOSE: 88 mg/dL (ref 70–99)
Potassium: 4.2 mEq/L (ref 3.5–5.1)
Sodium: 138 mEq/L (ref 135–145)
Total Bilirubin: 0.4 mg/dL (ref 0.2–1.2)
Total Protein: 6.9 g/dL (ref 6.0–8.3)

## 2015-01-16 LAB — LIPID PANEL
Cholesterol: 220 mg/dL — ABNORMAL HIGH (ref 0–200)
HDL: 43.5 mg/dL (ref >39.00)
LDL CALC: 138 mg/dL — AB (ref 0–99)
NonHDL: 176.5
Total CHOL/HDL Ratio: 5
Triglycerides: 194 mg/dL — ABNORMAL HIGH (ref 0.0–149.0)
VLDL: 38.8 mg/dL (ref 0.0–40.0)

## 2015-01-16 LAB — VITAMIN D 25 HYDROXY (VIT D DEFICIENCY, FRACTURES): VITD: 22.42 ng/mL — ABNORMAL LOW (ref 30.00–100.00)

## 2015-01-16 LAB — HEMOGLOBIN A1C: Hgb A1c MFr Bld: 7 % — ABNORMAL HIGH (ref 4.6–6.5)

## 2015-01-22 ENCOUNTER — Other Ambulatory Visit: Payer: Self-pay | Admitting: Internal Medicine

## 2015-01-22 MED ORDER — EZETIMIBE 10 MG PO TABS: 10.0000 mg | ORAL_TABLET | Freq: Every day | ORAL | Status: DC

## 2015-01-22 NOTE — Addendum Note (Signed)
Addended by: Crecencio Mc on: 01/22/2015 01:06 PM   Modules accepted: Orders

## 2015-02-13 ENCOUNTER — Ambulatory Visit
Admission: RE | Admit: 2015-02-13 | Discharge: 2015-02-13 | Disposition: A | Discharge status: Home / Self Care | Payer: BLUE CROSS/BLUE SHIELD | Source: Ambulatory Visit | Attending: Internal Medicine | Admitting: Internal Medicine

## 2015-02-13 DIAGNOSIS — Z1231 Encounter for screening mammogram for malignant neoplasm of breast: Secondary | ICD-10-CM | POA: Insufficient documentation

## 2015-02-13 DIAGNOSIS — Z1239 Encounter for other screening for malignant neoplasm of breast: Secondary | ICD-10-CM

## 2015-02-13 HISTORY — DX: Malignant (primary) neoplasm, unspecified: C80.1

## 2015-02-20 ENCOUNTER — Ambulatory Visit: Payer: BLUE CROSS/BLUE SHIELD | Admitting: Endocrinology

## 2015-03-22 ENCOUNTER — Other Ambulatory Visit: Payer: Self-pay | Admitting: Internal Medicine

## 2015-04-10 ENCOUNTER — Encounter: Payer: Self-pay | Admitting: Internal Medicine

## 2015-04-10 ENCOUNTER — Encounter (INDEPENDENT_AMBULATORY_CARE_PROVIDER_SITE_OTHER): Payer: Self-pay

## 2015-04-10 ENCOUNTER — Ambulatory Visit (INDEPENDENT_AMBULATORY_CARE_PROVIDER_SITE_OTHER): Payer: BLUE CROSS/BLUE SHIELD | Admitting: Internal Medicine

## 2015-04-10 VITALS — BP 122/70 | HR 92 | Temp 98.1°F | Resp 14 | Ht 67.0 in | Wt 210.5 lb

## 2015-04-10 DIAGNOSIS — Z9841 Cataract extraction status, right eye: Secondary | ICD-10-CM | POA: Insufficient documentation

## 2015-04-10 DIAGNOSIS — E1165 Type 2 diabetes mellitus with hyperglycemia: Secondary | ICD-10-CM

## 2015-04-10 DIAGNOSIS — K635 Polyp of colon: Secondary | ICD-10-CM | POA: Diagnosis not present

## 2015-04-10 DIAGNOSIS — E669 Obesity, unspecified: Secondary | ICD-10-CM

## 2015-04-10 DIAGNOSIS — E782 Mixed hyperlipidemia: Secondary | ICD-10-CM | POA: Diagnosis not present

## 2015-04-10 DIAGNOSIS — E1122 Type 2 diabetes mellitus with diabetic chronic kidney disease: Secondary | ICD-10-CM

## 2015-04-10 DIAGNOSIS — E1129 Type 2 diabetes mellitus with other diabetic kidney complication: Secondary | ICD-10-CM | POA: Diagnosis not present

## 2015-04-10 DIAGNOSIS — IMO0002 Reserved for concepts with insufficient information to code with codable children: Secondary | ICD-10-CM

## 2015-04-10 DIAGNOSIS — N183 Chronic kidney disease, stage 3 (moderate): Secondary | ICD-10-CM

## 2015-04-10 DIAGNOSIS — Z9842 Cataract extraction status, left eye: Secondary | ICD-10-CM

## 2015-04-10 DIAGNOSIS — E559 Vitamin D deficiency, unspecified: Secondary | ICD-10-CM

## 2015-04-10 MED ORDER — INSULIN REGULAR HUMAN (CONC) 500 UNIT/ML ~~LOC~~ SOLN: SUBCUTANEOUS | Status: DC

## 2015-04-10 NOTE — Assessment & Plan Note (Signed)
6 month follow up  On cataract  Surgery bilaterally, was done in May,  Retina doctor next Monday

## 2015-04-10 NOTE — Progress Notes (Signed)
Pre-visit discussion using our clinic review tool. No additional management support is needed unless otherwise documented below in the visit note.  

## 2015-04-10 NOTE — Progress Notes (Signed)
Subjective:  Patient ID: Pamela Harris, female    DOB: [birth year]  Age: 59 y.o. MRN: [id number]  CC: The primary encounter diagnosis was S/P bilateral cataract extraction. Diagnoses of Colon polyp, hyperplastic, Hypercholesterolemia with hypertriglyceridemia, DM (diabetes mellitus), type 2, uncontrolled, with renal complications, CKD stage 3 due to type 2 diabetes mellitus, Vitamin D deficiency, and Obesity (BMI 30-39.9) were also pertinent to this visit.  HPI Pamela Harris presents for follow up on type 2 DM.  Previously referred to Endocrinologist Dr. Howell Rucks was was abel to imporve glyceminc control with  for uncontrolled DM.  Last a1c was greatly improved with change to U500 concentrated Humulin R   Regimen:  70  Units at breakfast,  70 units at lunch and 80 units at dinner.  Occasional lows occurin the  late afternoon if she skips lunch, or if her fasting sugar is 90,  Has decreased morning dose to 60 units.   Foot exam normal today.    Lab Results  Component Value Date   HGBA1C 7.0* 01/16/2015     Outpatient Prescriptions Prior to Visit  Medication Sig Dispense Refill  . amLODipine (NORVASC) 10 MG tablet Take 1 tablet (10 mg total) by mouth daily. 90 tablet 1  . atenolol (TENORMIN) 50 MG tablet Take 1 tablet (50 mg total) by mouth daily. 90 tablet 1  . fenofibrate 160 MG tablet Take 1 tablet (160 mg total) by mouth daily. 90 tablet 1  . fish oil-omega-3 fatty acids 1000 MG capsule Take 1 g by mouth 2 (two) times daily.    [name] glucose blood (ACCU-CHEK AVIVA) test strip Check sugars three times daily 306 each 2  . HEALTHY ACCENTS UNIFINE PENTIP 31G X 8 MM MISC USE AS DIRECTED 100 each PRN  . Insulin Syringe-Needle U-100 (INSULIN SYRINGE .3CC/31GX5/16") 31G X 5/16" 0.3 ML MISC Use for insulin administration three times daily. 100 each 6  . losartan (COZAAR) 100 MG tablet TAKE ONE TABLET BY MOUTH ONE TIME DAILY 90 tablet 1  . Multiple Vitamins-Minerals (MULTIVITAMIN WITH  MINERALS) tablet Take 1 tablet by mouth daily.    . Probiotic Product (PROBIOTIC MATURE ADULT) CAPS Take 1 capsule by mouth daily.    . insulin regular human CONCENTRATED (HUMULIN R) 500 UNIT/ML SOLN injection Give U 500 kwik pen Patient uses 75 units BF/65 units with lunch and 75 units with supper on the pen. Max daily dose of U500 is 215 units. 20 mL 2  . zoster vaccine live, PF, (ZOSTAVAX) 61683 UNT/0.65ML injection Inject 19,400 Units into the skin once. 1 each 0  . ezetimibe (ZETIA) 10 MG tablet Take 1 tablet (10 mg total) by mouth daily. (Patient not taking: Reported on 04/10/2015) 30 tablet 2   No facility-administered medications prior to visit.    Review of Systems;  Patient denies headache, fevers, malaise, unintentional weight loss, skin rash, eye pain, sinus congestion and sinus pain, sore throat, dysphagia,  hemoptysis , cough, dyspnea, wheezing, chest pain, palpitations, orthopnea, edema, abdominal pain, nausea, melena, diarrhea, constipation, flank pain, dysuria, hematuria, urinary  Frequency, nocturia, numbness, tingling, seizures,  Focal weakness, Loss of consciousness,  Tremor, insomnia, depression, anxiety, and suicidal ideation.      Objective:  BP 122/70 mmHg  Pulse 92  Temp(Src) 98.1 F (36.7 C) (Oral)  Resp 14  Ht 5\' 7"  (1.702 m)  Wt 210 lb 8 oz (95.482 kg)  BMI 32.96 kg/m2  SpO2 98%  BP Readings from Last 3 Encounters:  04/10/15 122/70  01/09/15 122/64  01/06/15 126/68    Wt Readings from Last 3 Encounters:  04/10/15 210 lb 8 oz (95.482 kg)  01/09/15 206 lb (93.441 kg)  01/06/15 206 lb 8 oz (93.668 kg)    General appearance: alert, cooperative and appears stated age Ears: normal TM's and external ear canals both ears Throat: lips, mucosa, and tongue normal; teeth and gums normal Neck: no adenopathy, no carotid bruit, supple, symmetrical, trachea midline and thyroid not enlarged, symmetric, no tenderness/mass/nodules Back: symmetric, no curvature. ROM  normal. No CVA tenderness. Lungs: clear to auscultation bilaterally Heart: regular rate and rhythm, S1, S2 normal, no murmur, click, rub or gallop Abdomen: soft, non-tender; bowel sounds normal; no masses,  no organomegaly Pulses: 2+ and symmetric Skin: Skin color, texture, turgor normal. No rashes or lesions Lymph nodes: Cervical, supraclavicular, and axillary nodes normal.  Lab Results  Component Value Date   HGBA1C 7.0* 01/16/2015   HGBA1C 8.3* 10/17/2014   HGBA1C 8.5* 07/02/2014    Lab Results  Component Value Date   CREATININE 1.12 01/16/2015   CREATININE 1.08 10/17/2014   CREATININE 1.1 07/02/2014    Lab Results  Component Value Date   GLUCOSE 88 01/16/2015   CHOL 220* 01/16/2015   TRIG 194.0* 01/16/2015   HDL 43.50 01/16/2015   LDLDIRECT 152.0 10/17/2014   LDLCALC 138* 01/16/2015   ALT 15 01/16/2015   AST 14 01/16/2015   NA 138 01/16/2015   K 4.2 01/16/2015   CL 106 01/16/2015   CREATININE 1.12 01/16/2015   BUN 24* 01/16/2015   CO2 26 01/16/2015   TSH 2.60 10/12/2013   HGBA1C 7.0* 01/16/2015   MICROALBUR 0.8 07/02/2014    Mm Digital Screening Bilateral  02/13/2015   CLINICAL DATA:  Screening.  EXAM: DIGITAL SCREENING BILATERAL MAMMOGRAM WITH CAD  COMPARISON:  Previous exam(s).  ACR Breast Density Category b: There are scattered areas of fibroglandular density.  FINDINGS: There are no findings suspicious for malignancy. Images were processed with CAD.  IMPRESSION: No mammographic evidence of malignancy. A result letter of this screening mammogram will be mailed directly to the patient.  RECOMMENDATION: Screening mammogram in one year. (Code:SM-B-01Y)  BI-RADS CATEGORY  1: Negative.   Electronically Signed   By: Nolon Nations M.D.   On: 02/13/2015 16:58    Assessment & Plan:   Problem List Items Addressed This Visit      Unprioritized   DM (diabetes mellitus), type 2, uncontrolled, with renal complications    Improved control with use of concentrate  insulin. No chanes today, return for a1c in 2 weeks.      Relevant Medications   insulin regular human CONCENTRATED (HUMULIN R) 500 UNIT/ML injection   Other Relevant Orders   Comprehensive metabolic panel   Hemoglobin A1c   Microalbumin / creatinine urine ratio   Obesity (BMI 30-39.9)    I have addressed  BMI and recommended wt loss of 10% of body weigh over the next 6 months using a low glycemic index diet and regular exercise a minimum of 5 days per week      Relevant Medications   insulin regular human CONCENTRATED (HUMULIN R) 500 UNIT/ML injection   CKD stage 3 due to type 2 diabetes mellitus    Recent GFR stable and c/w Stage  3 CKD. Follows with Dr Holley Raring. Update next week. She is avoiding NSAIDS but refuses to take statins and eztimibe  Lab Results  Component Value Date   CREATININE 1.12 01/16/2015   Lab Results  Component Value Date   NA 138 01/16/2015   K 4.2 01/16/2015   CL 106 01/16/2015   CO2 26 01/16/2015          Relevant Medications   insulin regular human CONCENTRATED (HUMULIN R) 500 UNIT/ML injection   S/P bilateral cataract extraction - Primary   Colon polyp, hyperplastic   Hypercholesterolemia with hypertriglyceridemia   Relevant Orders   LDL cholesterol, direct   Lipid panel    Other Visit Diagnoses    Vitamin D deficiency        Relevant Orders    Vit D  25 hydroxy (rtn osteoporosis monitoring)     A total of 25 minutes of face to face time was spent with patient more than half of which was spent in counselling about the above mentioned conditions  and coordination of care   I have discontinued Ms. Sanderson's zoster vaccine live (PF) and ezetimibe. I have also changed her insulin regular human CONCENTRATED. Additionally, I am having her maintain her PROBIOTIC MATURE ADULT, fish oil-omega-3 fatty acids, multivitamin with minerals, INSULIN SYRINGE .3CC/31GX5/16", glucose blood, amLODipine, atenolol, fenofibrate, losartan, and HEALTHY ACCENTS  UNIFINE PENTIP.  Meds ordered this encounter  Medications  . insulin regular human CONCENTRATED (HUMULIN R) 500 UNIT/ML injection    Sig: Give U 500 kwik pen Patient uses 75 units BF/65 units with lunch and 75 units with supper on the pen. Max daily dose of U500 is 215 units.    Dispense:  20 mL    Refill:  2    Medications Discontinued During This Encounter  Medication Reason  . ezetimibe (ZETIA) 10 MG tablet Patient Preference  . zoster vaccine live, PF, (ZOSTAVAX) 12197 UNT/0.65ML injection Patient Preference  . insulin regular human CONCENTRATED (HUMULIN R) 500 UNIT/ML SOLN injection Reorder  . ezetimibe (ZETIA) 10 MG tablet Patient Preference    Follow-up: Return in about 3 months (around 07/11/2015) for follow up diabetes.   Crecencio Mc, MD

## 2015-04-12 NOTE — Assessment & Plan Note (Addendum)
Recent GFR stable and c/w Stage  3 CKD. Follows with Dr Holley Raring. Update next week. She is avoiding NSAIDS but refuses to take statins and eztimibe  Lab Results  Component Value Date   CREATININE 1.12 01/16/2015   Lab Results  Component Value Date   NA 138 01/16/2015   K 4.2 01/16/2015   CL 106 01/16/2015   CO2 26 01/16/2015

## 2015-04-12 NOTE — Assessment & Plan Note (Signed)
I have addressed  BMI and recommended wt loss of 10% of body weigh over the next 6 months using a low glycemic index diet and regular exercise a minimum of 5 days per week.   

## 2015-04-12 NOTE — Assessment & Plan Note (Signed)
Improved control with use of concentrate insulin. No chanes today, return for a1c in 2 weeks.

## 2015-04-23 ENCOUNTER — Other Ambulatory Visit (INDEPENDENT_AMBULATORY_CARE_PROVIDER_SITE_OTHER): Payer: BLUE CROSS/BLUE SHIELD

## 2015-04-23 DIAGNOSIS — E782 Mixed hyperlipidemia: Secondary | ICD-10-CM

## 2015-04-23 DIAGNOSIS — E1129 Type 2 diabetes mellitus with other diabetic kidney complication: Secondary | ICD-10-CM | POA: Diagnosis not present

## 2015-04-23 DIAGNOSIS — E1165 Type 2 diabetes mellitus with hyperglycemia: Secondary | ICD-10-CM | POA: Diagnosis not present

## 2015-04-23 DIAGNOSIS — E559 Vitamin D deficiency, unspecified: Secondary | ICD-10-CM

## 2015-04-23 DIAGNOSIS — IMO0002 Reserved for concepts with insufficient information to code with codable children: Secondary | ICD-10-CM

## 2015-04-23 LAB — LIPID PANEL
CHOLESTEROL: 222 mg/dL — AB (ref 0–200)
HDL: 41.8 mg/dL (ref >39.00)
LDL Cholesterol: 155 mg/dL — ABNORMAL HIGH (ref 0–99)
NonHDL: 180.28
TRIGLYCERIDES: 128 mg/dL (ref 0.0–149.0)
Total CHOL/HDL Ratio: 5
VLDL: 25.6 mg/dL (ref 0.0–40.0)

## 2015-04-23 LAB — COMPREHENSIVE METABOLIC PANEL
ALBUMIN: 4 g/dL (ref 3.5–5.2)
ALT: 17 U/L (ref 0–35)
AST: 16 U/L (ref 0–37)
Alkaline Phosphatase: 46 U/L (ref 39–117)
BUN: 23 mg/dL (ref 6–23)
CHLORIDE: 105 meq/L (ref 96–112)
CO2: 24 mEq/L (ref 19–32)
CREATININE: 1.01 mg/dL (ref 0.40–1.20)
Calcium: 9.4 mg/dL (ref 8.4–10.5)
GFR: 59.95 mL/min — ABNORMAL LOW (ref >60.00)
GLUCOSE: 133 mg/dL — AB (ref 70–99)
Potassium: 4.3 mEq/L (ref 3.5–5.1)
SODIUM: 139 meq/L (ref 135–145)
Total Bilirubin: 0.4 mg/dL (ref 0.2–1.2)
Total Protein: 7.1 g/dL (ref 6.0–8.3)

## 2015-04-23 LAB — MICROALBUMIN / CREATININE URINE RATIO
CREATININE, U: 94.5 mg/dL
MICROALB/CREAT RATIO: 0.7 mg/g (ref 0.0–30.0)

## 2015-04-23 LAB — HEMOGLOBIN A1C: Hgb A1c MFr Bld: 6.8 % — ABNORMAL HIGH (ref 4.6–6.5)

## 2015-04-23 LAB — VITAMIN D 25 HYDROXY (VIT D DEFICIENCY, FRACTURES): VITD: 43.5 ng/mL (ref 30.00–100.00)

## 2015-04-23 LAB — LDL CHOLESTEROL, DIRECT: LDL DIRECT: 153 mg/dL

## 2015-04-24 ENCOUNTER — Encounter: Payer: Self-pay | Admitting: *Deleted

## 2015-05-07 ENCOUNTER — Other Ambulatory Visit: Payer: Self-pay | Admitting: *Deleted

## 2015-05-07 MED ORDER — INSULIN PEN NEEDLE 31G X 8 MM MISC: Status: DC

## 2015-05-11 LAB — HM DIABETES EYE EXAM

## 2015-06-23 ENCOUNTER — Other Ambulatory Visit: Payer: Self-pay

## 2015-06-23 MED ORDER — INSULIN PEN NEEDLE 31G X 8 MM MISC: Status: DC

## 2015-07-11 ENCOUNTER — Ambulatory Visit (INDEPENDENT_AMBULATORY_CARE_PROVIDER_SITE_OTHER): Payer: BLUE CROSS/BLUE SHIELD | Admitting: Internal Medicine

## 2015-07-11 VITALS — BP 130/72 | HR 94 | Temp 98.5°F | Resp 16 | Ht 67.0 in | Wt 212.4 lb

## 2015-07-11 DIAGNOSIS — Z23 Encounter for immunization: Secondary | ICD-10-CM | POA: Diagnosis not present

## 2015-07-11 DIAGNOSIS — H9193 Unspecified hearing loss, bilateral: Secondary | ICD-10-CM

## 2015-07-11 DIAGNOSIS — E1121 Type 2 diabetes mellitus with diabetic nephropathy: Secondary | ICD-10-CM | POA: Diagnosis not present

## 2015-07-11 DIAGNOSIS — E11319 Type 2 diabetes mellitus with unspecified diabetic retinopathy without macular edema: Secondary | ICD-10-CM | POA: Diagnosis not present

## 2015-07-11 DIAGNOSIS — E1165 Type 2 diabetes mellitus with hyperglycemia: Secondary | ICD-10-CM | POA: Diagnosis not present

## 2015-07-11 DIAGNOSIS — H9190 Unspecified hearing loss, unspecified ear: Secondary | ICD-10-CM

## 2015-07-11 DIAGNOSIS — E782 Mixed hyperlipidemia: Secondary | ICD-10-CM

## 2015-07-11 DIAGNOSIS — Z794 Long term (current) use of insulin: Secondary | ICD-10-CM | POA: Diagnosis not present

## 2015-07-11 DIAGNOSIS — D239 Other benign neoplasm of skin, unspecified: Secondary | ICD-10-CM

## 2015-07-11 DIAGNOSIS — IMO0002 Reserved for concepts with insufficient information to code with codable children: Secondary | ICD-10-CM

## 2015-07-11 DIAGNOSIS — I10 Essential (primary) hypertension: Secondary | ICD-10-CM

## 2015-07-11 MED ORDER — INSULIN REGULAR HUMAN (CONC) 500 UNIT/ML ~~LOC~~ SOLN: SUBCUTANEOUS | Status: DC

## 2015-07-11 NOTE — Patient Instructions (Addendum)
Referral to [facility] for hearing test is in process  Your fasting labs are not due until Dec 1   If you have another sugar < 80 in the late afternoon,  Reduce your lunchtime insulin dose to 60 units OR eat a light protein snack around 3 pm (nuts,  Cheese)

## 2015-07-11 NOTE — Progress Notes (Signed)
Pre visit review using our clinic review tool, if applicable. No additional management support is needed unless otherwise documented below in the visit note. 

## 2015-07-11 NOTE — Progress Notes (Signed)
Subjective:  Patient ID: Pamela Harris, female    DOB: 17-Jul-1956  Age: 59 y.o. MRN: [id number]  CC: The primary encounter diagnosis was Hearing loss, unspecified laterality. Diagnoses of Uncontrolled type 2 diabetes mellitus with diabetic nephropathy, with long-term current use of insulin ([place]), Diabetic retinopathy without macular edema associated with type 2 diabetes mellitus, unspecified retinopathy severity ([place]), Encounter for immunization, Hypercholesterolemia with hypertriglyceridemia, Essential hypertension, Dysplastic nevus, and Hearing loss, bilateral were also pertinent to this visit.r anterior  HPI MARYCAROL HAUSE presents for. 3 month follow up on diabetes.  Patient has no complaints today.  Patient is following a low glycemic index diet and taking all prescribed medications regularly without side effects.  Fasting sugars have been under less than 140 most of the time and post prandials have been under 160 except on rare occasions. Patient is exercising about 3 times per week and intentionally trying to lose weight .  Patient has had an eye exam in the last 12 months and checks feet regularly for signs of infection.  Patient does not walk barefoot outside,  And denies an numbness tingling or burning in feet. Patient is up to date on all recommended vaccinations.  Had 3 biopsies done last week by Nehemiah Massed on her anterior thighs,  Dysplastic nevi .    She has noted some loss of hearing and is requesting audiology evaluation.   Outpatient Prescriptions Prior to Visit  Medication Sig Dispense Refill  . amLODipine (NORVASC) 10 MG tablet Take 1 tablet (10 mg total) by mouth daily. 90 tablet 1  . atenolol (TENORMIN) 50 MG tablet Take 1 tablet (50 mg total) by mouth daily. 90 tablet 1  . fenofibrate 160 MG tablet Take 1 tablet (160 mg total) by mouth daily. 90 tablet 1  . fish oil-omega-3 fatty acids 1000 MG capsule Take 1 g by mouth 2 (two) times daily.    [name] glucose blood  (ACCU-CHEK AVIVA) test strip Check sugars three times daily 306 each 2  . Insulin Pen Needle (HEALTHY ACCENTS UNIFINE PENTIP) 31G X 8 MM MISC USE AS DIRECTED, TID   E11.29 100 each 12  . Insulin Syringe-Needle U-100 (INSULIN SYRINGE .3CC/31GX5/16") 31G X 5/16" 0.3 ML MISC Use for insulin administration three times daily. 100 each 6  . losartan (COZAAR) 100 MG tablet TAKE ONE TABLET BY MOUTH ONE TIME DAILY 90 tablet 1  . Multiple Vitamins-Minerals (MULTIVITAMIN WITH MINERALS) tablet Take 1 tablet by mouth daily.    . Probiotic Product (PROBIOTIC MATURE ADULT) CAPS Take 1 capsule by mouth daily.    . insulin regular human CONCENTRATED (HUMULIN R) 500 UNIT/ML injection Give U 500 kwik pen Patient uses 75 units BF/65 units with lunch and 75 units with supper on the pen. Max daily dose of U500 is 215 units. 20 mL 2   No facility-administered medications prior to visit.    Review of Systems;  Patient denies headache, fevers, malaise, unintentional weight loss, skin rash, eye pain, sinus congestion and sinus pain, sore throat, dysphagia,  hemoptysis , cough, dyspnea, wheezing, chest pain, palpitations, orthopnea, edema, abdominal pain, nausea, melena, diarrhea, constipation, flank pain, dysuria, hematuria, urinary  Frequency, nocturia, numbness, tingling, seizures,  Focal weakness, Loss of consciousness,  Tremor, insomnia, depression, anxiety, and suicidal ideation.      Objective:  BP 130/72 mmHg  Pulse 94  Temp(Src) 98.5 F (36.9 C)  Resp 16  Ht 5\' 7"  (1.702 m)  Wt 212 lb 6.4 oz (96.344 kg)  BMI 33.26  kg/m2  SpO2 97%  BP Readings from Last 3 Encounters:  07/11/15 130/72  04/10/15 122/70  01/09/15 122/64    Wt Readings from Last 3 Encounters:  07/11/15 212 lb 6.4 oz (96.344 kg)  04/10/15 210 lb 8 oz (95.482 kg)  01/09/15 206 lb (93.441 kg)    General appearance: alert, cooperative and appears stated age Ears: normal TM's and external ear canals both ears Throat: lips, mucosa,  and tongue normal; teeth and gums normal Neck: no adenopathy, no carotid bruit, supple, symmetrical, trachea midline and thyroid not enlarged, symmetric, no tenderness/mass/nodules Back: symmetric, no curvature. ROM normal. No CVA tenderness. Lungs: clear to auscultation bilaterally Heart: regular rate and rhythm, S1, S2 normal, no murmur, click, rub or gallop Abdomen: soft, non-tender; bowel sounds normal; no masses,  no organomegaly Pulses: 2+ and symmetric Skin: Skin color, texture, turgor normal. No rashes or lesions Lymph nodes: Cervical, supraclavicular, and axillary nodes normal.  Lab Results  Component Value Date   HGBA1C 6.8* 04/23/2015   HGBA1C 7.0* 01/16/2015   HGBA1C 8.3* 10/17/2014    Lab Results  Component Value Date   CREATININE 1.01 04/23/2015   CREATININE 1.12 01/16/2015   CREATININE 1.08 10/17/2014    Lab Results  Component Value Date   GLUCOSE 133* 04/23/2015   CHOL 222* 04/23/2015   TRIG 128.0 04/23/2015   HDL 41.80 04/23/2015   LDLDIRECT 153.0 04/23/2015   LDLCALC 155* 04/23/2015   ALT 17 04/23/2015   AST 16 04/23/2015   NA 139 04/23/2015   K 4.3 04/23/2015   CL 105 04/23/2015   CREATININE 1.01 04/23/2015   BUN 23 04/23/2015   CO2 24 04/23/2015   TSH 2.60 10/12/2013   HGBA1C 6.8* 04/23/2015   MICROALBUR <0.7 04/23/2015    Mm Digital Screening Bilateral  02/13/2015  CLINICAL DATA:  Screening. EXAM: DIGITAL SCREENING BILATERAL MAMMOGRAM WITH CAD COMPARISON:  Previous exam(s). ACR Breast Density Category b: There are scattered areas of fibroglandular density. FINDINGS: There are no findings suspicious for malignancy. Images were processed with CAD. IMPRESSION: No mammographic evidence of malignancy. A result letter of this screening mammogram will be mailed directly to the patient. RECOMMENDATION: Screening mammogram in one year. (Code:SM-B-01Y) BI-RADS CATEGORY  1: Negative. Electronically Signed   By: Nolon Nations M.D.   On: 02/13/2015 16:58     Assessment & Plan:   Problem List Items Addressed This Visit    DM (diabetes mellitus), type 2, uncontrolled, with renal complications ([place])    Improved control with use of concentrate insulin. No changes today.  She will return in 2 weeks for A1c.  Lab Results  Component Value Date   MICROALBUR <0.7 04/23/2015    Lab Results  Component Value Date   HGBA1C 6.8* 04/23/2015           Relevant Medications   insulin regular human CONCENTRATED (HUMULIN R) 500 UNIT/ML injection   Other Relevant Orders   Comprehensive metabolic panel   Hemoglobin A1c   LDL cholesterol, direct   Lipid panel   Microalbumin / creatinine urine ratio   Hypercholesterolemia with hypertriglyceridemia    Managed with fenofibrate only due to concerns about statins.  Lab Results  Component Value Date   CHOL 222* 04/23/2015   HDL 41.80 04/23/2015   LDLCALC 155* 04/23/2015   LDLDIRECT 153.0 04/23/2015   TRIG 128.0 04/23/2015   CHOLHDL 5 04/23/2015          Essential hypertension    Well controlled on current regimen. Renal function  stable, no changes today.  Lab Results  Component Value Date   CREATININE 1.01 04/23/2015   BUN 23 04/23/2015   NA 139 04/23/2015   K 4.3 04/23/2015   CL 105 04/23/2015   CO2 24 04/23/2015        Dysplastic nevus    Recent biopsies s x 3, anterior  thighs.  [place],       Diabetic retinopathy ([place])    Seeing ophthalmology at [facility] regularly       Relevant Medications   insulin regular human CONCENTRATED (HUMULIN R) 500 UNIT/ML injection   Hearing loss - Primary    Ear exam is normal.  Referral to Audiology       Relevant Orders   Ambulatory referral to Audiology    Other Visit Diagnoses    Encounter for immunization           I am having Ms. Rancourt maintain her PROBIOTIC MATURE ADULT, fish oil-omega-3 fatty acids, multivitamin with minerals, INSULIN SYRINGE .3CC/31GX5/16", glucose blood, amLODipine, atenolol, fenofibrate,  losartan, Insulin Pen Needle, and insulin regular human CONCENTRATED.  Meds ordered this encounter  Medications  . insulin regular human CONCENTRATED (HUMULIN R) 500 UNIT/ML injection    Sig: Give U 500 kwik pen Patient uses 75 units BF/65 units with lunch and 75 units with supper on the pen. Max daily dose of U500 is 215 units.    Dispense:  20 mL    Refill:  2    Medications Discontinued During This Encounter  Medication Reason  . insulin regular human CONCENTRATED (HUMULIN R) 500 UNIT/ML injection Reorder    Follow-up: Return in about 3 months (around 10/11/2015).   Crecencio Mc, MD

## 2015-07-13 ENCOUNTER — Encounter: Payer: Self-pay | Admitting: Internal Medicine

## 2015-07-13 DIAGNOSIS — H9190 Unspecified hearing loss, unspecified ear: Secondary | ICD-10-CM | POA: Insufficient documentation

## 2015-07-13 NOTE — Assessment & Plan Note (Signed)
Recent biopsies s x 3, anterior  thighs.  [place],

## 2015-07-13 NOTE — Assessment & Plan Note (Signed)
Well controlled on current regimen. Renal function stable, no changes today.  Lab Results  Component Value Date   CREATININE 1.01 04/23/2015   BUN 23 04/23/2015   NA 139 04/23/2015   K 4.3 04/23/2015   CL 105 04/23/2015   CO2 24 04/23/2015

## 2015-07-13 NOTE — Assessment & Plan Note (Signed)
Managed with fenofibrate only due to concerns about statins.  Lab Results  Component Value Date   CHOL 222* 04/23/2015   HDL 41.80 04/23/2015   LDLCALC 155* 04/23/2015   LDLDIRECT 153.0 04/23/2015   TRIG 128.0 04/23/2015   CHOLHDL 5 04/23/2015

## 2015-07-13 NOTE — Assessment & Plan Note (Signed)
Ear exam is normal.  Referral to Audiology

## 2015-07-13 NOTE — Assessment & Plan Note (Signed)
Improved control with use of concentrate insulin. No changes today.  She will return in 2 weeks for A1c.  Lab Results  Component Value Date   MICROALBUR <0.7 04/23/2015    Lab Results  Component Value Date   HGBA1C 6.8* 04/23/2015

## 2015-07-13 NOTE — Assessment & Plan Note (Signed)
Seeing ophthalmology at [facility] regularly

## 2015-07-24 ENCOUNTER — Other Ambulatory Visit (INDEPENDENT_AMBULATORY_CARE_PROVIDER_SITE_OTHER): Payer: BLUE CROSS/BLUE SHIELD

## 2015-07-24 DIAGNOSIS — E1121 Type 2 diabetes mellitus with diabetic nephropathy: Secondary | ICD-10-CM

## 2015-07-24 LAB — COMPREHENSIVE METABOLIC PANEL
ALBUMIN: 3.9 g/dL (ref 3.5–5.2)
ALT: 20 U/L (ref 0–35)
AST: 16 U/L (ref 0–37)
Alkaline Phosphatase: 48 U/L (ref 39–117)
BILIRUBIN TOTAL: 0.4 mg/dL (ref 0.2–1.2)
BUN: 16 mg/dL (ref 6–23)
CALCIUM: 9 mg/dL (ref 8.4–10.5)
CHLORIDE: 105 meq/L (ref 96–112)
CO2: 26 meq/L (ref 19–32)
CREATININE: 0.99 mg/dL (ref 0.40–1.20)
GFR: 61.3 mL/min (ref >60.00)
Glucose, Bld: 126 mg/dL — ABNORMAL HIGH (ref 70–99)
Potassium: 3.9 mEq/L (ref 3.5–5.1)
SODIUM: 139 meq/L (ref 135–145)
Total Protein: 7 g/dL (ref 6.0–8.3)

## 2015-07-24 LAB — LIPID PANEL
CHOLESTEROL: 209 mg/dL — AB (ref 0–200)
HDL: 43 mg/dL (ref >39.00)
LDL Cholesterol: 140 mg/dL — ABNORMAL HIGH (ref 0–99)
NONHDL: 165.75
Total CHOL/HDL Ratio: 5
Triglycerides: 130 mg/dL (ref 0.0–149.0)
VLDL: 26 mg/dL (ref 0.0–40.0)

## 2015-07-24 LAB — LDL CHOLESTEROL, DIRECT: LDL DIRECT: 148 mg/dL

## 2015-07-24 LAB — HEMOGLOBIN A1C: HEMOGLOBIN A1C: 7.1 % — AB (ref 4.6–6.5)

## 2015-07-24 LAB — MICROALBUMIN / CREATININE URINE RATIO
CREATININE, U: 169.1 mg/dL
MICROALB UR: 1.8 mg/dL (ref 0.0–1.9)
MICROALB/CREAT RATIO: 1.1 mg/g (ref 0.0–30.0)

## 2015-07-25 ENCOUNTER — Encounter: Payer: Self-pay | Admitting: *Deleted

## 2015-08-04 ENCOUNTER — Other Ambulatory Visit: Payer: Self-pay

## 2015-08-04 MED ORDER — INSULIN REGULAR HUMAN (CONC) 500 UNIT/ML ~~LOC~~ SOLN: SUBCUTANEOUS | Status: DC

## 2015-10-16 LAB — HM DIABETES EYE EXAM

## 2015-10-20 ENCOUNTER — Encounter: Payer: Self-pay | Admitting: Internal Medicine

## 2015-10-24 ENCOUNTER — Encounter: Payer: Self-pay | Admitting: Internal Medicine

## 2015-10-24 ENCOUNTER — Ambulatory Visit (INDEPENDENT_AMBULATORY_CARE_PROVIDER_SITE_OTHER): Payer: BLUE CROSS/BLUE SHIELD | Admitting: Internal Medicine

## 2015-10-24 VITALS — BP 128/72 | HR 66 | Temp 98.1°F | Ht 67.0 in | Wt 214.6 lb

## 2015-10-24 DIAGNOSIS — E1165 Type 2 diabetes mellitus with hyperglycemia: Secondary | ICD-10-CM

## 2015-10-24 DIAGNOSIS — E669 Obesity, unspecified: Secondary | ICD-10-CM

## 2015-10-24 DIAGNOSIS — E782 Mixed hyperlipidemia: Secondary | ICD-10-CM

## 2015-10-24 DIAGNOSIS — E1121 Type 2 diabetes mellitus with diabetic nephropathy: Secondary | ICD-10-CM | POA: Diagnosis not present

## 2015-10-24 DIAGNOSIS — E08319 Diabetes mellitus due to underlying condition with unspecified diabetic retinopathy without macular edema: Secondary | ICD-10-CM

## 2015-10-24 DIAGNOSIS — I10 Essential (primary) hypertension: Secondary | ICD-10-CM

## 2015-10-24 DIAGNOSIS — N183 Chronic kidney disease, stage 3 (moderate): Secondary | ICD-10-CM

## 2015-10-24 DIAGNOSIS — E1122 Type 2 diabetes mellitus with diabetic chronic kidney disease: Secondary | ICD-10-CM

## 2015-10-24 DIAGNOSIS — Z794 Long term (current) use of insulin: Secondary | ICD-10-CM

## 2015-10-24 DIAGNOSIS — IMO0002 Reserved for concepts with insufficient information to code with codable children: Secondary | ICD-10-CM

## 2015-10-24 LAB — COMPREHENSIVE METABOLIC PANEL
ALBUMIN: 4.2 g/dL (ref 3.5–5.2)
ALK PHOS: 40 U/L (ref 39–117)
ALT: 18 U/L (ref 0–35)
AST: 15 U/L (ref 0–37)
BILIRUBIN TOTAL: 0.4 mg/dL (ref 0.2–1.2)
BUN: 23 mg/dL (ref 6–23)
CO2: 27 mEq/L (ref 19–32)
Calcium: 9.4 mg/dL (ref 8.4–10.5)
Chloride: 107 mEq/L (ref 96–112)
Creatinine, Ser: 1.09 mg/dL (ref 0.40–1.20)
GFR: 54.81 mL/min — AB (ref >60.00)
GLUCOSE: 93 mg/dL (ref 70–99)
Potassium: 4.6 mEq/L (ref 3.5–5.1)
Sodium: 141 mEq/L (ref 135–145)
TOTAL PROTEIN: 6.8 g/dL (ref 6.0–8.3)

## 2015-10-24 LAB — LIPID PANEL
CHOL/HDL RATIO: 6
Cholesterol: 250 mg/dL — ABNORMAL HIGH (ref 0–200)
HDL: 43.9 mg/dL (ref >39.00)
LDL CALC: 172 mg/dL — AB (ref 0–99)
NonHDL: 206.4
Triglycerides: 174 mg/dL — ABNORMAL HIGH (ref 0.0–149.0)
VLDL: 34.8 mg/dL (ref 0.0–40.0)

## 2015-10-24 LAB — HEMOGLOBIN A1C: HEMOGLOBIN A1C: 7.3 % — AB (ref 4.6–6.5)

## 2015-10-24 LAB — MICROALBUMIN / CREATININE URINE RATIO
Creatinine,U: 193.9 mg/dL
MICROALB UR: 3.9 mg/dL — AB (ref 0.0–1.9)
Microalb Creat Ratio: 2 mg/g (ref 0.0–30.0)

## 2015-10-24 LAB — LDL CHOLESTEROL, DIRECT: LDL DIRECT: 173 mg/dL

## 2015-10-24 NOTE — Progress Notes (Signed)
Subjective:  Patient ID: Pamela Harris, female    DOB: 04/14/1956  Age: 60 y.o. MRN: [id number]  CC: The primary encounter diagnosis was Hypercholesterolemia with hypertriglyceridemia. Diagnoses of Uncontrolled type 2 diabetes mellitus with diabetic nephropathy, with long-term current use of insulin ([place]), Diabetic retinopathy associated with diabetes mellitus due to underlying condition, macular edema presence unspecified, unspecified retinopathy severity ([place]), CKD stage 3 due to type 2 diabetes mellitus ([place]), Obesity (BMI 30-39.9), and Essential hypertension were also pertinent to this visit.  HPI Pamela Harris presents for 3 month follow up on diabetes.  Patient has no complaints today.  Patient is following a low glycemic index diet and taking all prescribed medications regularly without side effects.  Fasting sugars have been under less than 140 most of the time and post prandials have been under 160 except on rare occasions. Patient is exercising about 3 times per week and intentionally trying to lose weight .  Patient has had an eye exam in the last 12 months and checks feet regularly for signs of infection.  Patient does not walk barefoot outside,  And denies an numbness tingling or burning in feet. Patient is up to date on all recommended vaccinations  Had a low of 45 late afternoon,  Forgot to eat lunch. Has runs of good control followed by a week of higher than normal sugars.  Usinng 65 ,  65 and 75 units of r500 insulin    Check sugars bid/tid and prn low   Fasting today  Occasional shooting pains in both feet at night , which is a new symptoms   Goal wt 190  For  bmi   Lab Results  Component Value Date   HGBA1C 7.3* 10/24/2015   < 30nb  Outpatient Prescriptions Prior to Visit  Medication Sig Dispense Refill  . amLODipine (NORVASC) 10 MG tablet Take 1 tablet (10 mg total) by mouth daily. 90 tablet 1  . atenolol (TENORMIN) 50 MG tablet Take 1 tablet (50 mg total) by  mouth daily. 90 tablet 1  . fenofibrate 160 MG tablet Take 1 tablet (160 mg total) by mouth daily. 90 tablet 1  . fish oil-omega-3 fatty acids 1000 MG capsule Take 1 g by mouth 2 (two) times daily.    [name] glucose blood (ACCU-CHEK AVIVA) test strip Check sugars three times daily 306 each 2  . Insulin Pen Needle (HEALTHY ACCENTS UNIFINE PENTIP) 31G X 8 MM MISC USE AS DIRECTED, TID   E11.29 100 each 12  . insulin regular human CONCENTRATED (HUMULIN R) 500 UNIT/ML injection Give U 500 kwik pen Patient uses 75 units BF/65 units with lunch and 75 units with supper on the pen. Max daily dose of U500 is 215 units. 20 mL 2  . losartan (COZAAR) 100 MG tablet TAKE ONE TABLET BY MOUTH ONE TIME DAILY 90 tablet 1  . Multiple Vitamins-Minerals (MULTIVITAMIN WITH MINERALS) tablet Take 1 tablet by mouth daily.    . Probiotic Product (PROBIOTIC MATURE ADULT) CAPS Take 1 capsule by mouth daily.    . Insulin Syringe-Needle U-100 (INSULIN SYRINGE .3CC/31GX5/16") 31G X 5/16" 0.3 ML MISC Use for insulin administration three times daily. (Patient not taking: Reported on 10/24/2015) 100 each 6   No facility-administered medications prior to visit.    Review of Systems;  Patient denies headache, fevers, malaise, unintentional weight loss, skin rash, eye pain, sinus congestion and sinus pain, sore throat, dysphagia,  hemoptysis , cough, dyspnea, wheezing, chest pain, palpitations, orthopnea, edema, abdominal  pain, nausea, melena, diarrhea, constipation, flank pain, dysuria, hematuria, urinary  Frequency, nocturia, numbness, tingling, seizures,  Focal weakness, Loss of consciousness,  Tremor, insomnia, depression, anxiety, and suicidal ideation.      Objective:  BP 128/72 mmHg  Pulse 66  Temp(Src) 98.1 F (36.7 C) (Oral)  Ht 5\' 7"  (1.702 m)  Wt 214 lb 9.6 oz (97.342 kg)  BMI 33.60 kg/m2  SpO2 99%  BP Readings from Last 3 Encounters:  10/24/15 128/72  07/11/15 130/72  04/10/15 122/70    Wt Readings from Last 3  Encounters:  10/24/15 214 lb 9.6 oz (97.342 kg)  07/11/15 212 lb 6.4 oz (96.344 kg)  04/10/15 210 lb 8 oz (95.482 kg)    General appearance: alert, cooperative and appears stated age Ears: normal TM's and external ear canals both ears Throat: lips, mucosa, and tongue normal; teeth and gums normal Neck: no adenopathy, no carotid bruit, supple, symmetrical, trachea midline and thyroid not enlarged, symmetric, no tenderness/mass/nodules Back: symmetric, no curvature. ROM normal. No CVA tenderness. Lungs: clear to auscultation bilaterally Heart: regular rate and rhythm, S1, S2 normal, no murmur, click, rub or gallop Abdomen: soft, non-tender; bowel sounds normal; no masses,  no organomegaly Pulses: 2+ and symmetric Skin: Skin color, texture, turgor normal. No rashes or lesions Lymph nodes: Cervical, supraclavicular, and axillary nodes normal.  Lab Results  Component Value Date   HGBA1C 7.3* 10/24/2015   HGBA1C 7.1* 07/24/2015   HGBA1C 6.8* 04/23/2015    Lab Results  Component Value Date   CREATININE 1.09 10/24/2015   CREATININE 0.99 07/24/2015   CREATININE 1.01 04/23/2015    Lab Results  Component Value Date   GLUCOSE 93 10/24/2015   CHOL 250* 10/24/2015   TRIG 174.0* 10/24/2015   HDL 43.90 10/24/2015   LDLDIRECT 173.0 10/24/2015   LDLCALC 172* 10/24/2015   ALT 18 10/24/2015   AST 15 10/24/2015   NA 141 10/24/2015   K 4.6 10/24/2015   CL 107 10/24/2015   CREATININE 1.09 10/24/2015   BUN 23 10/24/2015   CO2 27 10/24/2015   TSH 2.60 10/12/2013   HGBA1C 7.3* 10/24/2015   MICROALBUR 3.9* 10/24/2015    Mm Digital Screening Bilateral  02/13/2015  CLINICAL DATA:  Screening. EXAM: DIGITAL SCREENING BILATERAL MAMMOGRAM WITH CAD COMPARISON:  Previous exam(s). ACR Breast Density Category b: There are scattered areas of fibroglandular density. FINDINGS: There are no findings suspicious for malignancy. Images were processed with CAD. IMPRESSION: No mammographic evidence of  malignancy. A result letter of this screening mammogram will be mailed directly to the patient. RECOMMENDATION: Screening mammogram in one year. (Code:SM-B-01Y) BI-RADS CATEGORY  1: Negative. Electronically Signed   By: Nolon Nations M.D.   On: 02/13/2015 16:58    Assessment & Plan:   Problem List Items Addressed This Visit    DM (diabetes mellitus), type 2, uncontrolled, with renal complications ([place])    Improved control with use of concentrate insulin. Lunchtime dose reduced to avoid recurrent hypoglycemia.  She may be developing early neropathyu Lab Results  Component Value Date   MICROALBUR 3.9* 10/24/2015    Lab Results  Component Value Date   HGBA1C 7.3* 10/24/2015             Essential hypertension    Well controlled on current regimen. Renal function stable, no changes today.      Obesity (BMI 30-39.9)    I have addressed  BMI and recommended wt loss of 10% of body weight over the next 6 months  using a low glycemic index diet and regular exercise a minimum of 5 days per week.        CKD stage 3 due to type 2 diabetes mellitus (HCC)    Recent GFR stable and c/w Stage  3 CKD. Follows with Dr Holley Raring. . She is avoiding NSAIDS but refuses to take statins and eztimibe  Lab Results  Component Value Date   CREATININE 1.09 10/24/2015   Lab Results  Component Value Date   NA 141 10/24/2015   K 4.6 10/24/2015   CL 107 10/24/2015   CO2 27 10/24/2015            Hypercholesterolemia with hypertriglyceridemia - Primary   Relevant Orders   LDL cholesterol, direct (Completed)   Diabetic retinopathy ([place])   Relevant Orders   Microalbumin / creatinine urine ratio (Completed)   Lipid panel (Completed)   Hemoglobin A1c (Completed)   Comprehensive metabolic panel (Completed)      A total of 25 minutes of face to face time was spent with patient more than half of which was spent in counselling about the above mentioned conditions  and coordination of care  I am  having Ms. Gambrill maintain her PROBIOTIC MATURE ADULT, fish oil-omega-3 fatty acids, multivitamin with minerals, INSULIN SYRINGE .3CC/31GX5/16", glucose blood, amLODipine, atenolol, fenofibrate, losartan, Insulin Pen Needle, and insulin regular human CONCENTRATED.  No orders of the defined types were placed in this encounter.    There are no discontinued medications.  Follow-up: Return in about 6 months (around 04/25/2016) for follow up diabetes.   Crecencio Mc, MD

## 2015-10-24 NOTE — Progress Notes (Signed)
Pre visit review using our clinic review tool, if applicable. No additional management support is needed unless otherwise documented below in the visit note. 

## 2015-10-24 NOTE — Patient Instructions (Signed)
You can try reduding your lunchtime dose of insulin by 50% if you are going to skip lunch  Your goal weight is 190 to get BMI < 30  Please try to get 30 minutes of exercise 5 days per week

## 2015-10-26 NOTE — Assessment & Plan Note (Signed)
Well controlled on current regimen. Renal function stable, no changes today. 

## 2015-10-26 NOTE — Assessment & Plan Note (Signed)
I have addressed  BMI and recommended wt loss of 10% of body weight over the next 6 months using a low glycemic index diet and regular exercise a minimum of 5 days per week.   

## 2015-10-26 NOTE — Assessment & Plan Note (Signed)
Improved control with use of concentrate insulin. Lunchtime dose reduced to avoid recurrent hypoglycemia.  She may be developing early neropathyu Lab Results  Component Value Date   MICROALBUR 3.9* 10/24/2015    Lab Results  Component Value Date   HGBA1C 7.3* 10/24/2015

## 2015-10-26 NOTE — Assessment & Plan Note (Signed)
Recent GFR stable and c/w Stage  3 CKD. Follows with Dr Holley Raring. . She is avoiding NSAIDS but refuses to take statins and eztimibe  Lab Results  Component Value Date   CREATININE 1.09 10/24/2015   Lab Results  Component Value Date   NA 141 10/24/2015   K 4.6 10/24/2015   CL 107 10/24/2015   CO2 27 10/24/2015

## 2015-10-27 ENCOUNTER — Encounter: Payer: Self-pay | Admitting: *Deleted

## 2015-12-08 ENCOUNTER — Other Ambulatory Visit: Payer: Self-pay

## 2015-12-08 MED ORDER — GLUCOSE BLOOD VI STRP: ORAL_STRIP | Status: DC

## 2015-12-25 ENCOUNTER — Other Ambulatory Visit: Payer: Self-pay | Admitting: Internal Medicine

## 2016-01-09 ENCOUNTER — Other Ambulatory Visit (HOSPITAL_COMMUNITY)
Admission: RE | Admit: 2016-01-09 | Discharge: 2016-01-09 | Disposition: A | Discharge status: Home / Self Care | Payer: BLUE CROSS/BLUE SHIELD | Source: Ambulatory Visit | Attending: Internal Medicine | Admitting: Internal Medicine

## 2016-01-09 ENCOUNTER — Ambulatory Visit (INDEPENDENT_AMBULATORY_CARE_PROVIDER_SITE_OTHER): Payer: BLUE CROSS/BLUE SHIELD | Admitting: Internal Medicine

## 2016-01-09 ENCOUNTER — Encounter: Payer: Self-pay | Admitting: Internal Medicine

## 2016-01-09 VITALS — BP 120/70 | HR 68 | Temp 98.4°F | Ht 67.0 in | Wt 214.0 lb

## 2016-01-09 DIAGNOSIS — E1122 Type 2 diabetes mellitus with diabetic chronic kidney disease: Secondary | ICD-10-CM

## 2016-01-09 DIAGNOSIS — Z124 Encounter for screening for malignant neoplasm of cervix: Secondary | ICD-10-CM | POA: Diagnosis not present

## 2016-01-09 DIAGNOSIS — Z01419 Encounter for gynecological examination (general) (routine) without abnormal findings: Secondary | ICD-10-CM | POA: Diagnosis not present

## 2016-01-09 DIAGNOSIS — E785 Hyperlipidemia, unspecified: Secondary | ICD-10-CM | POA: Diagnosis not present

## 2016-01-09 DIAGNOSIS — E1129 Type 2 diabetes mellitus with other diabetic kidney complication: Secondary | ICD-10-CM | POA: Diagnosis not present

## 2016-01-09 DIAGNOSIS — Z1151 Encounter for screening for human papillomavirus (HPV): Secondary | ICD-10-CM | POA: Diagnosis not present

## 2016-01-09 DIAGNOSIS — N183 Chronic kidney disease, stage 3 unspecified: Secondary | ICD-10-CM

## 2016-01-09 DIAGNOSIS — R5383 Other fatigue: Secondary | ICD-10-CM

## 2016-01-09 DIAGNOSIS — E113519 Type 2 diabetes mellitus with proliferative diabetic retinopathy with macular edema, unspecified eye: Secondary | ICD-10-CM

## 2016-01-09 DIAGNOSIS — E1165 Type 2 diabetes mellitus with hyperglycemia: Secondary | ICD-10-CM

## 2016-01-09 DIAGNOSIS — Z794 Long term (current) use of insulin: Secondary | ICD-10-CM

## 2016-01-09 DIAGNOSIS — Z0000 Encounter for general adult medical examination without abnormal findings: Secondary | ICD-10-CM | POA: Diagnosis not present

## 2016-01-09 DIAGNOSIS — E1121 Type 2 diabetes mellitus with diabetic nephropathy: Secondary | ICD-10-CM

## 2016-01-09 DIAGNOSIS — Z1239 Encounter for other screening for malignant neoplasm of breast: Secondary | ICD-10-CM

## 2016-01-09 DIAGNOSIS — IMO0002 Reserved for concepts with insufficient information to code with codable children: Secondary | ICD-10-CM

## 2016-01-09 NOTE — Progress Notes (Signed)
Pre visit review using our clinic review tool, if applicable. No additional management support is needed unless otherwise documented below in the visit note. 

## 2016-01-09 NOTE — Progress Notes (Signed)
Patient ID: Pamela Harris, female    DOB: [birth year]  Age: 60 y.o. MRN: [id number]  The patient is here for annual physical examination and management of other chronic and acute problems.  Due for PAP  Mammogram due Colonoscopy done at [facility] [place] in  2010 ,  Due again in 2020 Fasting today     The risk factors are reflected in the social history.  The roster of all physicians providing medical care to patient - is listed in the Snapshot section of the chart.  Home safety : The patient has smoke detectors in the home. They wear seatbelts.  There are no firearms at home. There is no violence in the home.   There is no risks for hepatitis, STDs or HIV. There is no   history of blood transfusion. They have no travel history to infectious disease endemic areas of the world.  The patient has seen their dentist in the last six month. They have seen their eye doctor in the last year.   They do not  have excessive sun exposure. Discussed the need for sun protection: hats, long sleeves and use of sunscreen if there is significant sun exposure.   Diet: the importance of a healthy diet is discussed. They do not  have a healthy diet 7 days per week   The benefits of regular aerobic exercise were discussed. She walks 4 times per week ,  20 minutes.   Depression screen: there are no signs or vegative symptoms of depression- irritability, change in appetite, anhedonia, sadness/tearfullness. .  The following portions of the patient's history were reviewed and updated as appropriate: allergies, current medications, past family history, past medical history,  past surgical history, past social history  and problem list.  Visual acuity was not assessed per patient preference since she has regular follow up with her ophthalmologist. Hearing and body mass index were assessed and reviewed.   During the course of the visit the patient was educated and counseled about appropriate screening and  preventive services including : fall prevention , diabetes screening, nutrition counseling, colorectal cancer screening, and recommended immunizations.    CC: The primary encounter diagnosis was Diabetes mellitus with other diabetic kidney complication, with long-term current use of insulin ([place]). 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  History Pamela Harris has a past medical history of Hyperlipidemia; Hypertension; Diabetes mellitus; and Cancer ([place]).   She has past surgical history that includes Eye surgery; Mouth surgery; and Closed reduction ankle dislocation (Left).   Her family history includes Cancer in her maternal aunt; Coronary artery disease in her father; Diabetes in her father and mother; Heart disease in her father, maternal grandfather, mother, and paternal grandfather; Hypertension in her mother; Osteoporosis in her mother; Stroke in her mother.She reports that she has never smoked. She has never used smokeless tobacco. She reports that she does not drink alcohol or use illicit drugs.  Outpatient Prescriptions Prior to Visit  Medication Sig Dispense Refill  . amLODipine (NORVASC) 10 MG tablet Take 1 tablet (10 mg) by mouth daily. 90 tablet 1  . atenolol (TENORMIN) 50 MG tablet Take 1 tablet (50 mg total) by mouth daily. 90 tablet  1  . fenofibrate 160 MG tablet Take 1 tablet (160 mg) by mouth daily. 90 tablet 1  . fish oil-omega-3 fatty acids 1000 MG capsule Take 1 g by mouth 2 (two) times daily.    [name] glucose blood (ACCU-CHEK AVIVA) test strip Check sugars three times daily e11.29 306 each 2   . Insulin Pen Needle (HEALTHY ACCENTS UNIFINE PENTIP) 31G X 8 MM MISC USE AS DIRECTED, TID   E11.29 100 each 12  . insulin regular human CONCENTRATED (HUMULIN R) 500 UNIT/ML injection Give U 500 kwik pen Patient uses 75 units BF/65 units with lunch and 75 units with supper on the pen. Max daily dose of U500 is 215 units. 20 mL 2  . losartan (COZAAR) 100 MG tablet Take one tablet by mouth one time daily 90 tablet 1  . Multiple Vitamins-Minerals (MULTIVITAMIN WITH MINERALS) tablet Take 1 tablet by mouth daily.    . Probiotic Product (PROBIOTIC MATURE ADULT) CAPS Take 1 capsule by mouth daily.    . Insulin Syringe-Needle U-100 (INSULIN SYRINGE .3CC/31GX5/16") 31G X 5/16" 0.3 ML MISC Use for insulin administration three times daily. (Patient not taking: Reported on 10/24/2015) 100 each 6   No facility-administered medications prior to visit.    Review of Systems   Patient denies headache, fevers, malaise, unintentional weight loss, skin rash, eye pain, sinus congestion and sinus pain, sore throat, dysphagia,  hemoptysis , cough, dyspnea, wheezing, chest pain, palpitations, orthopnea, edema, abdominal pain, nausea, melena, diarrhea, constipation, flank pain, dysuria, hematuria, urinary  Frequency, nocturia, numbness, tingling, seizures,  Focal weakness, Loss of consciousness,  Tremor, insomnia, depression, anxiety, and suicidal ideation.      Objective:  BP 120/70 mmHg  Pulse 68  Temp(Src) 98.4 F (36.9 C) (Oral)  Ht 5\' 7"  (1.702 m)  Wt 214 lb (97.07 kg)  BMI 33.51 kg/m2  LMP 01/29/2014 (Approximate)  Physical Exam   General Appearance:    Alert, cooperative, no distress, appears stated age  Head:    Normocephalic, without obvious abnormality, atraumatic  Eyes:    PERRL, conjunctiva/corneas clear, EOM's intact, fundi    benign, both eyes  Ears:    Normal TM's and external ear canals, both ears  Nose:   Nares normal, septum midline, mucosa normal, no drainage    or sinus tenderness   Throat:   Lips, mucosa, and tongue normal; teeth and gums normal  Neck:   Supple, symmetrical, trachea midline, no adenopathy;    thyroid:  no enlargement/tenderness/nodules; no carotid   bruit or JVD  Back:     Symmetric, no curvature, ROM normal, no CVA tenderness  Lungs:     Clear to auscultation bilaterally, respirations unlabored  Chest Wall:    No tenderness or deformity   Heart:    Regular rate and rhythm, S1 and S2 normal, no murmur, rub   or gallop  Breast Exam:    No tenderness, masses, or nipple abnormality  Abdomen:     Soft, non-tender, bowel sounds active all four quadrants,    no masses, no organomegaly  Genitalia:    Pelvic: cervix normal in appearance, external genitalia normal, no adnexal masses or tenderness, no cervical motion tenderness, rectovaginal septum normal, uterus normal size, shape, and consistency and vagina normal without discharge  Extremities:   Extremities normal, atraumatic, no cyanosis or edema  Pulses:   2+ and symmetric all extremities  Skin:   Skin color, texture, turgor normal, no rashes or lesions  Lymph nodes:   Cervical, supraclavicular,  and axillary nodes normal  Neurologic:   CNII-XII intact, normal strength, sensation and reflexes    throughout     Assessment & Plan:   Problem List Items Addressed This Visit    DM (diabetes mellitus), type 2, uncontrolled, with renal complications ([place])    Improved control with use of concentrate insulin. Lunchtime dose reduced to avoid recurrent hypoglycemia.  She will return inJune for follow up with repeat testing.  Lab Results  Component Value Date   MICROALBUR 3.9* 10/24/2015    Lab Results  Component Value Date   HGBA1C 7.3* 10/24/2015               Encounter for preventive health examination    Annual comprehensive preventive exam was done as well as an evaluation and management of chronic conditions .  During the course of the visit the patient was educated and counseled about  appropriate screening and preventive services including :  diabetes screening, lipid analysis with projected  10 year  risk for CAD , nutrition counseling, breast, cervical and colorectal cancer screening, and recommended immunizations.  Printed recommendations for health maintenance screenings was given. PAP smear was done       CKD stage 3 due to type 2 diabetes mellitus [facility])    Patient now seeing Nephrology,  Med list reviewed, no meds that are C/I      Proliferative diabetic retinopathy [facility])    She has been keepig her annual eye exam with Dr  Oval Linsey       Other Visit Diagnoses    Diabetes mellitus with other diabetic kidney complication, with long-term current use of insulin ([place])    -  Primary    Relevant Orders    Comprehensive metabolic panel    Lipid panel    Hemoglobin A1c    Hyperlipidemia        Relevant Orders    LDL cholesterol, direct    Breast cancer screening        Relevant Orders    MM DIGITAL SCREENING BILATERAL    Other fatigue        Relevant Orders    TSH    Cervical cancer screening        Relevant Orders    Cytology - PAP       I have discontinued Ms. Buist's INSULIN SYRINGE .3CC/31GX5/16". I am also having her maintain her PROBIOTIC MATURE ADULT, fish oil-omega-3 fatty acids, multivitamin with minerals, atenolol, Insulin Pen Needle, insulin regular human CONCENTRATED, glucose blood, losartan, amLODipine, and fenofibrate.  No orders of the defined types were placed in this encounter.    Medications Discontinued During This Encounter  Medication Reason  . Insulin Syringe-Needle U-100 (INSULIN SYRINGE .3CC/31GX5/16") 31G X 5/16" 0.3 ML MISC Patient Preference    Follow-up: No Follow-up on file.   Crecencio Mc, MD

## 2016-01-09 NOTE — Patient Instructions (Addendum)
  You might want to try a premixed protein drink called Premier Protein shake  in caramel favor.   It is less $$$ and very low sugar.    160 cal  30 g protein  1 g sugar 50% calcium needs   . To make a low carb chip :  Take the Joseph's Lavash or Pita bread,  Or the [place] Low carb whole wheat tortilla   Place on metal cookie sheet  Brush with olive oil  Sprinkle garlic powder (NOT garlic salt), grated parmesan cheese, mediterranean seasoning , or all of them?  Bake at 225 or 250 for 90 minutes   We have substitutions for your potatoes!!  Try the mashed cauliflower and riced cauliflower dishes by [employer] instead of rice and mashed potatoes  Mashed turnips are also very low carb!   For dessert:  Try the Dannon Lt n Fit greek yogurt  Salted caramel  and top with reddi Whip .  8 carbs,  80 calories  Try Oikos Triple Zero [place] Yogurt in the salted caramel, and the coffee flavors  With Whipped Cream for dessert  You can take colace 200 mg at bedtime and metamucil 30 minutes before dinner to keep bowels regular. This will also curb your appetite  25 to 35 g fiber daily is also important for management of constipation  Mission  Whole wheat tortillas are a great bread substitute,  Gives you 26 g fiber per burrito.  They say "carb balance " on the front .

## 2016-01-10 NOTE — Assessment & Plan Note (Signed)
Annual comprehensive preventive exam was done as well as an evaluation and management of chronic conditions .  During the course of the visit the patient was educated and counseled about appropriate screening and preventive services including :  diabetes screening, lipid analysis with projected  10 year  risk for CAD , nutrition counseling, breast, cervical and colorectal cancer screening, and recommended immunizations.  Printed recommendations for health maintenance screenings was given. PAP smear was done  

## 2016-01-10 NOTE — Assessment & Plan Note (Signed)
Patient now seeing Nephrology,  Med list reviewed, no meds that are C/I

## 2016-01-10 NOTE — Assessment & Plan Note (Addendum)
Improved control with use of concentrate insulin. Lunchtime dose reduced to avoid recurrent hypoglycemia.  She will return inJune for follow up with repeat testing.  Lab Results  Component Value Date   MICROALBUR 3.9* 10/24/2015    Lab Results  Component Value Date   HGBA1C 7.3* 10/24/2015

## 2016-01-10 NOTE — Assessment & Plan Note (Signed)
She has been [place] her annual eye exam with Dr  Oval Linsey

## 2016-01-12 NOTE — Addendum Note (Signed)
Addended by: Nanci Pina on: 01/12/2016 09:25 AM   Modules accepted: Orders

## 2016-01-14 LAB — CYTOLOGY - PAP

## 2016-01-23 ENCOUNTER — Telehealth: Payer: Self-pay | Admitting: *Deleted

## 2016-01-23 NOTE — Telephone Encounter (Signed)
Prior authorization for humulin done via phone ref [id number].

## 2016-01-23 NOTE — Telephone Encounter (Signed)
A prior Authorization is needed for patients Humulin. Pharmacy [facility] in [place]

## 2016-01-26 DIAGNOSIS — N183 Chronic kidney disease, stage 3 (moderate): Secondary | ICD-10-CM | POA: Diagnosis not present

## 2016-01-26 DIAGNOSIS — E1122 Type 2 diabetes mellitus with diabetic chronic kidney disease: Secondary | ICD-10-CM | POA: Diagnosis not present

## 2016-01-26 DIAGNOSIS — E785 Hyperlipidemia, unspecified: Secondary | ICD-10-CM | POA: Diagnosis not present

## 2016-01-26 DIAGNOSIS — I129 Hypertensive chronic kidney disease with stage 1 through stage 4 chronic kidney disease, or unspecified chronic kidney disease: Secondary | ICD-10-CM | POA: Diagnosis not present

## 2016-01-27 ENCOUNTER — Telehealth: Payer: Self-pay | Admitting: Internal Medicine

## 2016-01-27 NOTE — Telephone Encounter (Signed)
Humulin for Diabetes, E11.29 for when they call back.

## 2016-01-27 NOTE — Telephone Encounter (Signed)
Pamela Harris called from [facility] wanting to know what was the medication being used for?

## 2016-01-30 ENCOUNTER — Other Ambulatory Visit: Payer: BLUE CROSS/BLUE SHIELD

## 2016-02-02 ENCOUNTER — Other Ambulatory Visit (INDEPENDENT_AMBULATORY_CARE_PROVIDER_SITE_OTHER): Payer: BLUE CROSS/BLUE SHIELD

## 2016-02-02 DIAGNOSIS — Z749 Problem related to care provider dependency, unspecified: Secondary | ICD-10-CM | POA: Diagnosis not present

## 2016-02-02 DIAGNOSIS — E785 Hyperlipidemia, unspecified: Secondary | ICD-10-CM | POA: Diagnosis not present

## 2016-02-02 DIAGNOSIS — Z794 Long term (current) use of insulin: Secondary | ICD-10-CM

## 2016-02-02 DIAGNOSIS — E1129 Type 2 diabetes mellitus with other diabetic kidney complication: Secondary | ICD-10-CM | POA: Diagnosis not present

## 2016-02-02 DIAGNOSIS — R5383 Other fatigue: Secondary | ICD-10-CM | POA: Diagnosis not present

## 2016-02-02 LAB — COMPREHENSIVE METABOLIC PANEL
ALT: 17 U/L (ref 0–35)
AST: 15 U/L (ref 0–37)
Albumin: 4 g/dL (ref 3.5–5.2)
Alkaline Phosphatase: 45 U/L (ref 39–117)
BUN: 21 mg/dL (ref 6–23)
CALCIUM: 9.1 mg/dL (ref 8.4–10.5)
CO2: 25 meq/L (ref 19–32)
Chloride: 105 mEq/L (ref 96–112)
Creatinine, Ser: 1.08 mg/dL (ref 0.40–1.20)
GFR: 55.34 mL/min — AB (ref >60.00)
Glucose, Bld: 155 mg/dL — ABNORMAL HIGH (ref 70–99)
POTASSIUM: 4.5 meq/L (ref 3.5–5.1)
Sodium: 139 mEq/L (ref 135–145)
Total Bilirubin: 0.4 mg/dL (ref 0.2–1.2)
Total Protein: 6.5 g/dL (ref 6.0–8.3)

## 2016-02-02 LAB — LIPID PANEL
CHOL/HDL RATIO: 5
Cholesterol: 218 mg/dL — ABNORMAL HIGH (ref 0–200)
HDL: 39.9 mg/dL (ref >39.00)
LDL CALC: 142 mg/dL — AB (ref 0–99)
NonHDL: 178.14
TRIGLYCERIDES: 180 mg/dL — AB (ref 0.0–149.0)
VLDL: 36 mg/dL (ref 0.0–40.0)

## 2016-02-02 LAB — LDL CHOLESTEROL, DIRECT: Direct LDL: 154 mg/dL

## 2016-02-02 LAB — HEMOGLOBIN A1C: Hgb A1c MFr Bld: 7.3 % — ABNORMAL HIGH (ref 4.6–6.5)

## 2016-02-02 LAB — TSH: TSH: 1.87 u[IU]/mL (ref 0.35–4.50)

## 2016-02-05 ENCOUNTER — Encounter: Payer: Self-pay | Admitting: *Deleted

## 2016-02-16 ENCOUNTER — Telehealth: Payer: Self-pay | Admitting: *Deleted

## 2016-02-16 NOTE — Telephone Encounter (Signed)
Patient's Humulin U-500 Claiborne Rigg was denied by [facility].  Patient is a poorly controlled diabetic who has to inject > 100 units per dose.  I do not understand why this was denied. Perhaps they were waiting for a call back?  If it was the [place] they would not cover, then find out if [employer] will cover the insulin in  a bottle an she can inject it the old fashioned way. Juliann Pulse has the rejection notice

## 2016-02-16 NOTE — Telephone Encounter (Signed)
We did not have the [facility] information on file in chart so I called patient to make sure she has coverage with [facility] patient coverage is with [employer] through husbands insurance patient has enough medication til August.

## 2016-02-16 NOTE — Telephone Encounter (Signed)
Void message,see previous telephone note

## 2016-02-16 NOTE — Telephone Encounter (Signed)
ID# [id number] Rx BIN# [phone]

## 2016-02-16 NOTE — Telephone Encounter (Signed)
Patients husband requested a call in reference to the [facility] Rx card. Dannielle Huh [phone]

## 2016-02-17 NOTE — Telephone Encounter (Signed)
Called [facility] center gave patients DX code and was advised patient medication is approved and should go through next refill. Notified patient and advised patient to have medication refilled before running completely and if she has any problems to call office.

## 2016-04-02 ENCOUNTER — Encounter: Payer: Self-pay | Admitting: Internal Medicine

## 2016-04-05 DIAGNOSIS — E113553 Type 2 diabetes mellitus with stable proliferative diabetic retinopathy, bilateral: Secondary | ICD-10-CM | POA: Diagnosis not present

## 2016-04-05 DIAGNOSIS — H26499 Other secondary cataract, unspecified eye: Secondary | ICD-10-CM | POA: Diagnosis not present

## 2016-04-11 ENCOUNTER — Other Ambulatory Visit: Payer: Self-pay | Admitting: Internal Medicine

## 2016-04-21 ENCOUNTER — Other Ambulatory Visit: Payer: Self-pay | Admitting: Internal Medicine

## 2016-04-21 ENCOUNTER — Ambulatory Visit
Admission: RE | Admit: 2016-04-21 | Discharge: 2016-04-21 | Disposition: A | Discharge status: Home / Self Care | Payer: BLUE CROSS/BLUE SHIELD | Source: Ambulatory Visit | Attending: Internal Medicine | Admitting: Internal Medicine

## 2016-04-21 DIAGNOSIS — Z1231 Encounter for screening mammogram for malignant neoplasm of breast: Secondary | ICD-10-CM | POA: Insufficient documentation

## 2016-04-21 DIAGNOSIS — R928 Other abnormal and inconclusive findings on diagnostic imaging of breast: Secondary | ICD-10-CM | POA: Insufficient documentation

## 2016-04-21 DIAGNOSIS — Z1239 Encounter for other screening for malignant neoplasm of breast: Secondary | ICD-10-CM

## 2016-04-23 ENCOUNTER — Other Ambulatory Visit: Payer: Self-pay | Admitting: Internal Medicine

## 2016-04-23 DIAGNOSIS — N6310 Unspecified lump in the right breast, unspecified quadrant: Secondary | ICD-10-CM

## 2016-04-28 ENCOUNTER — Encounter: Payer: Self-pay | Admitting: Internal Medicine

## 2016-04-28 ENCOUNTER — Ambulatory Visit (INDEPENDENT_AMBULATORY_CARE_PROVIDER_SITE_OTHER): Payer: BLUE CROSS/BLUE SHIELD | Admitting: Internal Medicine

## 2016-04-28 VITALS — BP 110/62 | HR 68 | Wt 215.0 lb

## 2016-04-28 DIAGNOSIS — Z23 Encounter for immunization: Secondary | ICD-10-CM

## 2016-04-28 DIAGNOSIS — I10 Essential (primary) hypertension: Secondary | ICD-10-CM | POA: Diagnosis not present

## 2016-04-28 DIAGNOSIS — E669 Obesity, unspecified: Secondary | ICD-10-CM

## 2016-04-28 DIAGNOSIS — E1121 Type 2 diabetes mellitus with diabetic nephropathy: Secondary | ICD-10-CM

## 2016-04-28 DIAGNOSIS — E1165 Type 2 diabetes mellitus with hyperglycemia: Secondary | ICD-10-CM | POA: Diagnosis not present

## 2016-04-28 DIAGNOSIS — Z794 Long term (current) use of insulin: Secondary | ICD-10-CM

## 2016-04-28 DIAGNOSIS — IMO0002 Reserved for concepts with insufficient information to code with codable children: Secondary | ICD-10-CM

## 2016-04-28 NOTE — Progress Notes (Signed)
Subjective:  Patient ID: Pamela Harris, female    DOB: 1956-02-03  Age: 60 y.o. MRN: [id number]  CC: The primary encounter diagnosis was Need for influenza vaccination. Diagnoses of Uncontrolled type 2 diabetes mellitus with diabetic nephropathy, with long-term current use of insulin ([place]), Essential hypertension, and Obesity (BMI 30-39.9) were also pertinent to this visit.  HPI Pamela Harris presents for  6 month follow up on type 2 DM previously managed by Endocrinology Her a1c is risen slightly from 6.8 one year ago to 7.3  6 months ago  and had not changed per June 12 labs.  she reports that her blood sugars have been labile  On "Good days":  113 to 90 fasting,  80 pre dinner   On "Bad days"  :  fastings 150 to 200 ,  Lunch is lower at around  120,  Then dinner in the 90's eveinng dose of insulin is 75 units of the R 500, but she will adjust to 85 units if a large meal is planned.   The insulin is costing her $100 month.  She is not exercising, despite being retired and struggling with obesity.  No barriers except lack of motivation.   Right eye surgery planned for Friday [facility]  Breast exam sept 19th     Lab Results  Component Value Date   HGBA1C 7.3 (H) 02/02/2016      Outpatient Medications Prior to Visit  Medication Sig Dispense Refill  . amLODipine (NORVASC) 10 MG tablet Take 1 tablet (10 mg) by mouth daily. 90 tablet 1  . atenolol (TENORMIN) 50 MG tablet Take 1 tablet (50 mg) by mouth daily. 90 tablet 0  . fenofibrate 160 MG tablet Take 1 tablet (160 mg) by mouth daily. 90 tablet 1  . fish oil-omega-3 fatty acids 1000 MG capsule Take 1 g by mouth 2 (two) times daily.    [name] glucose blood (ACCU-CHEK AVIVA) test strip Check sugars three times daily e11.29 306 each 2  . Insulin Pen Needle (HEALTHY ACCENTS UNIFINE PENTIP) 31G X 8 MM MISC USE AS DIRECTED, TID   E11.29 100 each 12  . insulin regular human CONCENTRATED (HUMULIN R) 500 UNIT/ML injection  Give U 500 kwik pen Patient uses 75 units BF/65 units with lunch and 75 units with supper on the pen. Max daily dose of U500 is 215 units. 20 mL 2  . losartan (COZAAR) 100 MG tablet Take one tablet by mouth one time daily 90 tablet 1  . Multiple Vitamins-Minerals (MULTIVITAMIN WITH MINERALS) tablet Take 1 tablet by mouth daily.    . Probiotic Product (PROBIOTIC MATURE ADULT) CAPS Take 1 capsule by mouth daily.     No facility-administered medications prior to visit.     Review of Systems;  Patient denies headache, fevers, malaise, unintentional weight loss, skin rash, eye pain, sinus congestion and sinus pain, sore throat, dysphagia,  hemoptysis , cough, dyspnea, wheezing, chest pain, palpitations, orthopnea, edema, abdominal pain, nausea, melena, diarrhea, constipation, flank pain, dysuria, hematuria, urinary  Frequency, nocturia, numbness, tingling, seizures,  Focal weakness, Loss of consciousness,  Tremor, insomnia, depression, anxiety, and suicidal ideation.      Objective:  BP 110/62   Pulse 68   Wt 215 lb (97.5 kg)   LMP 01/29/2014 (Approximate)   SpO2 97%   BMI 33.67 kg/m   BP Readings from Last 3 Encounters:  04/28/16 110/62  01/09/16 120/70  10/24/15 128/72    Wt Readings from Last 3  Encounters:  04/28/16 215 lb (97.5 kg)  01/09/16 214 lb (97.1 kg)  10/24/15 214 lb 9.6 oz (97.3 kg)    General appearance: alert, cooperative and appears stated age Ears: normal TM's and external ear canals both ears Throat: lips, mucosa, and tongue normal; teeth and gums normal Neck: no adenopathy, no carotid bruit, supple, symmetrical, trachea midline and thyroid not enlarged, symmetric, no tenderness/mass/nodules Back: symmetric, no curvature. ROM normal. No CVA tenderness. Lungs: clear to auscultation bilaterally Heart: regular rate and rhythm, S1, S2 normal, no murmur, click, rub or gallop Abdomen: soft, non-tender; bowel sounds normal; no masses,  no organomegaly Pulses: 2+ and  symmetric Skin: Skin color, texture, turgor normal. No rashes or lesions Lymph nodes: Cervical, supraclavicular, and axillary nodes normal.  Lab Results  Component Value Date   HGBA1C 7.3 (H) 02/02/2016   HGBA1C 7.3 (H) 10/24/2015   HGBA1C 7.1 (H) 07/24/2015    Lab Results  Component Value Date   CREATININE 1.08 02/02/2016   CREATININE 1.09 10/24/2015   CREATININE 0.99 07/24/2015    Lab Results  Component Value Date   GLUCOSE 155 (H) 02/02/2016   CHOL 218 (H) 02/02/2016   TRIG 180.0 (H) 02/02/2016   HDL 39.90 02/02/2016   LDLDIRECT 154.0 02/02/2016   LDLCALC 142 (H) 02/02/2016   ALT 17 02/02/2016   AST 15 02/02/2016   NA 139 02/02/2016   K 4.5 02/02/2016   CL 105 02/02/2016   CREATININE 1.08 02/02/2016   BUN 21 02/02/2016   CO2 25 02/02/2016   TSH 1.87 02/02/2016   HGBA1C 7.3 (H) 02/02/2016   MICROALBUR 3.9 (H) 10/24/2015    Mm Screening Breast Tomo Bilateral  Result Date: 04/21/2016 CLINICAL DATA:  Screening. EXAM: 2D DIGITAL SCREENING BILATERAL MAMMOGRAM WITH CAD AND ADJUNCT TOMO COMPARISON:  Previous exam(s). ACR Breast Density Category b: There are scattered areas of fibroglandular density. FINDINGS: In the right breast, a possible mass warrants further evaluation. In the left breast, no findings suspicious for malignancy. Images were processed with CAD. IMPRESSION: Further evaluation is suggested for possible mass in the right breast. RECOMMENDATION: Diagnostic mammogram and possibly ultrasound of the right breast. (Code:[id number]) The patient will be contacted regarding the findings, and additional imaging will be scheduled. BI-RADS CATEGORY  0: Incomplete. Need additional imaging evaluation and/or prior mammograms for comparison. Electronically Signed   By: Pamelia Hoit M.D.   On: 04/21/2016 18:53    Assessment & Plan:   Problem List Items Addressed This Visit    DM (diabetes mellitus), type 2, uncontrolled, with renal complications ([place])    Labile sugars with no  real cauality after reviewing  Her diet.  She has been urged to start exercising daily and start checking 2 hour post prandials dinners . Repeat a1c sue in a few days.       Essential hypertension    Well controlled on current regimen. Renal function stable, no changes today  Lab Results  Component Value Date   CREATININE 1.08 02/02/2016   Lab Results  Component Value Date   NA 139 02/02/2016   K 4.5 02/02/2016   CL 105 02/02/2016   CO2 25 02/02/2016        Obesity (BMI 30-39.9)    I have addressed  BMI and recommended that patient start exercising with a goal of 30 minutes of aerobic exercise a minimum of 5 days per week.       Other Visit Diagnoses    Need for influenza vaccination    -  Primary   Relevant Orders   Flu Vaccine QUAD 36+ mos PF IM (Fluarix & Fluzone Quad PF) (Completed)    A total of 25 minutes of face to face time was spent with patient more than half of which was spent in counselling about the above mentioned conditions  and coordination of care   I am having Ms. Surrette maintain her PROBIOTIC MATURE ADULT, fish oil-omega-3 fatty acids, multivitamin with minerals, Insulin Pen Needle, insulin regular human CONCENTRATED, glucose blood, losartan, amLODipine, fenofibrate, and atenolol.  No orders of the defined types were placed in this encounter.   There are no discontinued medications.  Follow-up: Return in about 3 months (around 07/28/2016), or FASTING LABS SEPT 19TH REQUESTED , for follow up diabetes.   Crecencio Mc, MD

## 2016-04-28 NOTE — Patient Instructions (Addendum)
Please Check your blood sugars 2 hours after  Dinner,   followed by morning fasting   Labs due after Sept 13th   I WANT YOU TO START WALKING FOR 30 MINUTES EVERY DAY.  THIS WILL IMPROVE YOUR GLYCEMIC CONTROL

## 2016-04-30 DIAGNOSIS — H26499 Other secondary cataract, unspecified eye: Secondary | ICD-10-CM | POA: Diagnosis not present

## 2016-05-01 NOTE — Assessment & Plan Note (Signed)
Well controlled on current regimen. Renal function stable, no changes today  Lab Results  Component Value Date   CREATININE 1.08 02/02/2016   Lab Results  Component Value Date   NA 139 02/02/2016   K 4.5 02/02/2016   CL 105 02/02/2016   CO2 25 02/02/2016

## 2016-05-01 NOTE — Assessment & Plan Note (Signed)
I have addressed  BMI and recommended that patient start exercising with a goal of 30 minutes of aerobic exercise a minimum of 5 days per week.

## 2016-05-01 NOTE — Assessment & Plan Note (Signed)
Labile sugars with no real cauality after reviewing  Her diet.  She has been urged to start exercising daily and start checking 2 hour post prandials dinners . Repeat a1c sue in a few days.

## 2016-05-05 ENCOUNTER — Telehealth: Payer: Self-pay

## 2016-05-05 DIAGNOSIS — E1165 Type 2 diabetes mellitus with hyperglycemia: Secondary | ICD-10-CM

## 2016-05-05 DIAGNOSIS — E1121 Type 2 diabetes mellitus with diabetic nephropathy: Secondary | ICD-10-CM

## 2016-05-05 DIAGNOSIS — Z794 Long term (current) use of insulin: Secondary | ICD-10-CM

## 2016-05-05 DIAGNOSIS — IMO0002 Reserved for concepts with insufficient information to code with codable children: Secondary | ICD-10-CM

## 2016-05-05 DIAGNOSIS — IMO0001 Reserved for inherently not codable concepts without codable children: Secondary | ICD-10-CM

## 2016-05-05 NOTE — Telephone Encounter (Signed)
Pt coming for labs 05/06/16. Please place future orders. Thank you.

## 2016-05-06 ENCOUNTER — Other Ambulatory Visit (INDEPENDENT_AMBULATORY_CARE_PROVIDER_SITE_OTHER): Payer: BLUE CROSS/BLUE SHIELD

## 2016-05-06 DIAGNOSIS — IMO0002 Reserved for concepts with insufficient information to code with codable children: Secondary | ICD-10-CM

## 2016-05-06 DIAGNOSIS — E1121 Type 2 diabetes mellitus with diabetic nephropathy: Secondary | ICD-10-CM | POA: Diagnosis not present

## 2016-05-06 DIAGNOSIS — Z794 Long term (current) use of insulin: Secondary | ICD-10-CM | POA: Diagnosis not present

## 2016-05-06 DIAGNOSIS — E1165 Type 2 diabetes mellitus with hyperglycemia: Secondary | ICD-10-CM | POA: Diagnosis not present

## 2016-05-06 LAB — COMPREHENSIVE METABOLIC PANEL
ALK PHOS: 50 U/L (ref 39–117)
ALT: 19 U/L (ref 0–35)
AST: 15 U/L (ref 0–37)
Albumin: 4 g/dL (ref 3.5–5.2)
BILIRUBIN TOTAL: 0.5 mg/dL (ref 0.2–1.2)
BUN: 19 mg/dL (ref 6–23)
CO2: 27 mEq/L (ref 19–32)
CREATININE: 0.93 mg/dL (ref 0.40–1.20)
Calcium: 9.1 mg/dL (ref 8.4–10.5)
Chloride: 104 mEq/L (ref 96–112)
GFR: 65.7 mL/min (ref >60.00)
GLUCOSE: 146 mg/dL — AB (ref 70–99)
Potassium: 4.2 mEq/L (ref 3.5–5.1)
SODIUM: 138 meq/L (ref 135–145)
TOTAL PROTEIN: 7.1 g/dL (ref 6.0–8.3)

## 2016-05-06 LAB — LIPID PANEL
Cholesterol: 241 mg/dL — ABNORMAL HIGH (ref 0–200)
HDL: 43.1 mg/dL (ref >39.00)
LDL Cholesterol: 162 mg/dL — ABNORMAL HIGH (ref 0–99)
NONHDL: 198.08
Total CHOL/HDL Ratio: 6
Triglycerides: 182 mg/dL — ABNORMAL HIGH (ref 0.0–149.0)
VLDL: 36.4 mg/dL (ref 0.0–40.0)

## 2016-05-06 LAB — MICROALBUMIN / CREATININE URINE RATIO
Creatinine,U: 173.8 mg/dL
MICROALB UR: 6.2 mg/dL — AB (ref 0.0–1.9)
MICROALB/CREAT RATIO: 3.6 mg/g (ref 0.0–30.0)

## 2016-05-06 LAB — HEMOGLOBIN A1C: HEMOGLOBIN A1C: 7.4 % — AB (ref 4.6–6.5)

## 2016-05-11 ENCOUNTER — Ambulatory Visit
Admission: RE | Admit: 2016-05-11 | Discharge: 2016-05-11 | Disposition: A | Discharge status: Home / Self Care | Payer: BLUE CROSS/BLUE SHIELD | Source: Ambulatory Visit | Attending: Internal Medicine | Admitting: Internal Medicine

## 2016-05-11 DIAGNOSIS — N6310 Unspecified lump in the right breast, unspecified quadrant: Secondary | ICD-10-CM

## 2016-05-11 DIAGNOSIS — N6001 Solitary cyst of right breast: Secondary | ICD-10-CM | POA: Insufficient documentation

## 2016-05-11 DIAGNOSIS — N63 Unspecified lump in breast: Secondary | ICD-10-CM | POA: Diagnosis not present

## 2016-05-18 ENCOUNTER — Other Ambulatory Visit: Payer: Self-pay | Admitting: Internal Medicine

## 2016-05-18 NOTE — Telephone Encounter (Signed)
Pt husband called to follow up on refill. Pt will be going out of town on this Friday. Thank you!

## 2016-06-28 ENCOUNTER — Other Ambulatory Visit: Payer: Self-pay | Admitting: Internal Medicine

## 2016-07-22 DIAGNOSIS — Z85828 Personal history of other malignant neoplasm of skin: Secondary | ICD-10-CM | POA: Diagnosis not present

## 2016-07-22 DIAGNOSIS — L570 Actinic keratosis: Secondary | ICD-10-CM | POA: Diagnosis not present

## 2016-07-22 DIAGNOSIS — D1800 Hemangioma unspecified site: Secondary | ICD-10-CM | POA: Diagnosis not present

## 2016-07-22 DIAGNOSIS — Z1283 Encounter for screening for malignant neoplasm of skin: Secondary | ICD-10-CM | POA: Diagnosis not present

## 2016-07-22 DIAGNOSIS — D485 Neoplasm of uncertain behavior of skin: Secondary | ICD-10-CM | POA: Diagnosis not present

## 2016-08-02 DIAGNOSIS — I129 Hypertensive chronic kidney disease with stage 1 through stage 4 chronic kidney disease, or unspecified chronic kidney disease: Secondary | ICD-10-CM | POA: Diagnosis not present

## 2016-08-02 DIAGNOSIS — N183 Chronic kidney disease, stage 3 (moderate): Secondary | ICD-10-CM | POA: Diagnosis not present

## 2016-08-02 DIAGNOSIS — E1122 Type 2 diabetes mellitus with diabetic chronic kidney disease: Secondary | ICD-10-CM | POA: Diagnosis not present

## 2016-08-02 DIAGNOSIS — R809 Proteinuria, unspecified: Secondary | ICD-10-CM | POA: Diagnosis not present

## 2016-08-09 ENCOUNTER — Other Ambulatory Visit: Payer: Self-pay | Admitting: Internal Medicine

## 2016-08-12 ENCOUNTER — Encounter (INDEPENDENT_AMBULATORY_CARE_PROVIDER_SITE_OTHER): Payer: Self-pay

## 2016-08-12 ENCOUNTER — Ambulatory Visit (INDEPENDENT_AMBULATORY_CARE_PROVIDER_SITE_OTHER): Payer: BLUE CROSS/BLUE SHIELD | Admitting: Internal Medicine

## 2016-08-12 VITALS — BP 112/82 | HR 83 | Temp 97.9°F | Resp 16 | Ht 67.0 in | Wt 214.0 lb

## 2016-08-12 DIAGNOSIS — E1165 Type 2 diabetes mellitus with hyperglycemia: Secondary | ICD-10-CM

## 2016-08-12 DIAGNOSIS — E782 Mixed hyperlipidemia: Secondary | ICD-10-CM | POA: Diagnosis not present

## 2016-08-12 DIAGNOSIS — E1122 Type 2 diabetes mellitus with diabetic chronic kidney disease: Secondary | ICD-10-CM | POA: Diagnosis not present

## 2016-08-12 DIAGNOSIS — E1121 Type 2 diabetes mellitus with diabetic nephropathy: Secondary | ICD-10-CM | POA: Diagnosis not present

## 2016-08-12 DIAGNOSIS — E669 Obesity, unspecified: Secondary | ICD-10-CM

## 2016-08-12 DIAGNOSIS — Z794 Long term (current) use of insulin: Secondary | ICD-10-CM

## 2016-08-12 DIAGNOSIS — N183 Chronic kidney disease, stage 3 unspecified: Secondary | ICD-10-CM

## 2016-08-12 DIAGNOSIS — IMO0002 Reserved for concepts with insufficient information to code with codable children: Secondary | ICD-10-CM

## 2016-08-12 LAB — LIPID PANEL
CHOL/HDL RATIO: 5
Cholesterol: 232 mg/dL — ABNORMAL HIGH (ref 0–200)
HDL: 42.7 mg/dL (ref >39.00)
LDL CALC: 158 mg/dL — AB (ref 0–99)
NONHDL: 189.75
Triglycerides: 160 mg/dL — ABNORMAL HIGH (ref 0.0–149.0)
VLDL: 32 mg/dL (ref 0.0–40.0)

## 2016-08-12 LAB — COMPREHENSIVE METABOLIC PANEL
ALT: 18 U/L (ref 0–35)
AST: 16 U/L (ref 0–37)
Albumin: 4.2 g/dL (ref 3.5–5.2)
Alkaline Phosphatase: 44 U/L (ref 39–117)
BILIRUBIN TOTAL: 0.5 mg/dL (ref 0.2–1.2)
BUN: 24 mg/dL — AB (ref 6–23)
CALCIUM: 9.4 mg/dL (ref 8.4–10.5)
CO2: 27 meq/L (ref 19–32)
CREATININE: 1.14 mg/dL (ref 0.40–1.20)
Chloride: 105 mEq/L (ref 96–112)
GFR: 51.9 mL/min — ABNORMAL LOW (ref >60.00)
GLUCOSE: 105 mg/dL — AB (ref 70–99)
Potassium: 4.4 mEq/L (ref 3.5–5.1)
SODIUM: 139 meq/L (ref 135–145)
Total Protein: 6.9 g/dL (ref 6.0–8.3)

## 2016-08-12 LAB — HEMOGLOBIN A1C: Hgb A1c MFr Bld: 7.1 % — ABNORMAL HIGH (ref 4.6–6.5)

## 2016-08-12 NOTE — Patient Instructions (Signed)
Merry Christmas!  I'll send your labs in a few days   USE THAT BIKE EVERY DAY!!!

## 2016-08-12 NOTE — Progress Notes (Signed)
Subjective:  Patient ID: Pamela Harris, female    DOB: 1956-04-23  Age: 60 y.o. MRN: [id number]  CC: The primary encounter diagnosis was Uncontrolled type 2 diabetes mellitus with diabetic nephropathy, with long-term current use of insulin ([place]). Diagnoses of Hypercholesterolemia with hypertriglyceridemia and CKD stage 3 due to type 2 diabetes mellitus ([place]) were also pertinent to this visit.  HPI Pamela Harris presents for 3 month follow up on diabetes.  Patient has no complaints today.  Patient is following a low glycemic index diet and taking all prescribed medications regularly without side effects.  Fasting sugars have been under less than 140 most of the time and post prandials have been under 160 except on rare occasions. Patient is exercising about 3 times per week and intentionally trying to lose weight .  Patient has had an eye exam in the last 12 months and checks feet regularly for signs of infection.  Patient does not walk barefoot outside,  And denies an numbness tingling or burning in feet. Patient is up to date on all recommended vaccinations  Eye exam feb [facility]  Saw cck [place] 2 weeks ago once yearly now kidney function improved  Taking 6000 ius d3 daily due to kolllaru  Worst BS was 260  After De[employer] oatmeal creamfilled cookies . Several hypoglycemic episode averaging 3/ 2weeks ,  Reduced her evening insulin dose (uses a sliding scale at dinner)  of insulin   Lab Results  Component Value Date   HGBA1C 7.1 (H) 08/12/2016     Outpatient Medications Prior to Visit  Medication Sig Dispense Refill  . amLODipine (NORVASC) 10 MG tablet TAKE ONE TABLET BY MOUTH DAILY  90 tablet 2  . atenolol (TENORMIN) 50 MG tablet Take 1 tablet (50 mg) by mouth daily. 90 tablet 0  . fenofibrate 160 MG tablet TAKE ONE TABLET BY MOUTH DAILY  90 tablet 2  . fish oil-omega-3 fatty acids 1000 MG capsule Take 1 g by mouth 2 (two) times daily.    [name] glucose blood (ACCU-CHEK AVIVA)  test strip Check sugars three times daily e11.29 306 each 2  . HEALTHY ACCENTS UNIFINE PENTIP 31G X 8 MM MISC USE AS DIRECTED, ONE THREE TIMES DAILY  100 each 11  . HUMULIN R U-500 KWIKPEN 500 UNIT/ML kwikpen INJECT 75 UNITS AT BREAKFAST, 65 UNITS AT LUNCH, AND 75 UNITS WITH SUPPER.Marland KitchenMAX DAILY DOSE=215 UNITS 18 mL 1  . losartan (COZAAR) 100 MG tablet TAKE ONE TABLET BY MOUTH DAILY  90 tablet 2  . Multiple Vitamins-Minerals (MULTIVITAMIN WITH MINERALS) tablet Take 1 tablet by mouth daily.    . Probiotic Product (PROBIOTIC MATURE ADULT) CAPS Take 1 capsule by mouth daily.     No facility-administered medications prior to visit.     Review of Systems;  Patient denies headache, fevers, malaise, unintentional weight loss, skin rash, eye pain, sinus congestion and sinus pain, sore throat, dysphagia,  hemoptysis , cough, dyspnea, wheezing, chest pain, palpitations, orthopnea, edema, abdominal pain, nausea, melena, diarrhea, constipation, flank pain, dysuria, hematuria, urinary  Frequency, nocturia, numbness, tingling, seizures,  Focal weakness, Loss of consciousness,  Tremor, insomnia, depression, anxiety, and suicidal ideation.      Objective:  BP 112/82   Pulse 83   Temp 97.9 F (36.6 C) (Oral)   Resp 16   Ht 5\' 7"  (1.702 m)   Wt 214 lb (97.1 kg)   LMP 01/29/2014 (Approximate)   SpO2 97%   BMI 33.52 kg/m   BP Readings  from Last 3 Encounters:  08/12/16 112/82  04/28/16 110/62  01/09/16 120/70    Wt Readings from Last 3 Encounters:  08/12/16 214 lb (97.1 kg)  04/28/16 215 lb (97.5 kg)  01/09/16 214 lb (97.1 kg)    General appearance: alert, cooperative and appears stated age Ears: normal TM's and external ear canals both ears Throat: lips, mucosa, and tongue normal; teeth and gums normal Neck: no adenopathy, no carotid bruit, supple, symmetrical, trachea midline and thyroid not enlarged, symmetric, no tenderness/mass/nodules Back: symmetric, no curvature. ROM normal. No CVA  tenderness. Lungs: clear to auscultation bilaterally Heart: regular rate and rhythm, S1, S2 normal, no murmur, click, rub or gallop Abdomen: soft, non-tender; bowel sounds normal; no masses,  no organomegaly Pulses: 2+ and symmetric Skin: Skin color, texture, turgor normal. No rashes or lesions Lymph nodes: Cervical, supraclavicular, and axillary nodes normal.  Lab Results  Component Value Date   HGBA1C 7.1 (H) 08/12/2016   HGBA1C 7.4 (H) 05/06/2016   HGBA1C 7.3 (H) 02/02/2016    Lab Results  Component Value Date   CREATININE 1.14 08/12/2016   CREATININE 0.93 05/06/2016   CREATININE 1.08 02/02/2016    Lab Results  Component Value Date   GLUCOSE 105 (H) 08/12/2016   CHOL 232 (H) 08/12/2016   TRIG 160.0 (H) 08/12/2016   HDL 42.70 08/12/2016   LDLDIRECT 154.0 02/02/2016   LDLCALC 158 (H) 08/12/2016   ALT 18 08/12/2016   AST 16 08/12/2016   NA 139 08/12/2016   K 4.4 08/12/2016   CL 105 08/12/2016   CREATININE 1.14 08/12/2016   BUN 24 (H) 08/12/2016   CO2 27 08/12/2016   TSH 1.87 02/02/2016   HGBA1C 7.1 (H) 08/12/2016   MICROALBUR 6.2 (H) 05/06/2016    US Breast Ltd Uni Right Inc Axilla  Result Date: 05/31/2016 CLINICAL DATA:  60 year-old female for evaluation of possible right breast mass on screening mammogram. EXAM: 2D DIGITAL DIAGNOSTIC RIGHT MAMMOGRAM WITH ADJUNCT TOMO ULTRASOUND RIGHT BREAST COMPARISON:  Previous exam(s). ACR Breast Density Category b: There are scattered areas of fibroglandular density. FINDINGS: 2D and 3D spot compression views of the right breast demonstrate a circumscribed oval nodule within the anterior upper right breast. On physical exam, no palpable abnormalities are identified. Targeted ultrasound is performed, showing a 7 x 4 x 4 mm benign cyst at the 12 o'clock position of the right breast 3 cm from the nipple, corresponding to the screening study finding. IMPRESSION: Benign cyst in the upper right breast corresponding to the screening study  finding. RECOMMENDATION: Bilateral screening mammograms in 1 year. I have discussed the findings and recommendations with the patient. Results were also provided in writing at the conclusion of the visit. If applicable, a reminder letter will be sent to the patient regarding the next appointment. BI-RADS CATEGORY  2: Benign. Electronically Signed   By: Margarette Canada M.D.   On: 05/31/2016 10:48   Mm Diag Breast Tomo Uni Right  Result Date: 05/31/2016 CLINICAL DATA:  60 year-old female for evaluation of possible right breast mass on screening mammogram. EXAM: 2D DIGITAL DIAGNOSTIC RIGHT MAMMOGRAM WITH ADJUNCT TOMO ULTRASOUND RIGHT BREAST COMPARISON:  Previous exam(s). ACR Breast Density Category b: There are scattered areas of fibroglandular density. FINDINGS: 2D and 3D spot compression views of the right breast demonstrate a circumscribed oval nodule within the anterior upper right breast. On physical exam, no palpable abnormalities are identified. Targeted ultrasound is performed, showing a 7 x 4 x 4 mm benign cyst at  the 12 o'clock position of the right breast 3 cm from the nipple, corresponding to the screening study finding. IMPRESSION: Benign cyst in the upper right breast corresponding to the screening study finding. RECOMMENDATION: Bilateral screening mammograms in 1 year. I have discussed the findings and recommendations with the patient. Results were also provided in writing at the conclusion of the visit. If applicable, a reminder letter will be sent to the patient regarding the next appointment. BI-RADS CATEGORY  2: Benign. Electronically Signed   By: Margarette Canada M.D.   On: 05/31/2016 10:48    Assessment & Plan:   Problem List Items Addressed This Visit    CKD stage 3 due to type 2 diabetes mellitus [facility])    Patient now seeing Nephrology,  Med list reviewed, no meds that are C/I      DM (diabetes mellitus), type 2, uncontrolled, with renal complications ([place]) - Primary    Labile sugars with no  real causality after reviewing  her diet.  a1c has improved.  She has been urged to start exercising daily and start checking 2 hour post prandials dinners .  Lab Results  Component Value Date   HGBA1C 7.1 (H) 08/12/2016   Lab Results  Component Value Date   MICROALBUR 6.2 (H) 05/06/2016         Relevant Orders   Comprehensive metabolic panel (Completed)   Hemoglobin A1c (Completed)   Hypercholesterolemia with hypertriglyceridemia    Managed with fenofibrate only due to concerns about statins. Her 10 year risk is 16%  .  Will recommend statin therapy  Lab Results  Component Value Date   CHOL 232 (H) 08/12/2016   HDL 42.70 08/12/2016   LDLCALC 158 (H) 08/12/2016   LDLDIRECT 154.0 02/02/2016   TRIG 160.0 (H) 08/12/2016   CHOLHDL 5 08/12/2016          Relevant Orders   Lipid panel (Completed)      I am having Ms. Mcgeachy maintain her PROBIOTIC MATURE ADULT, fish oil-omega-3 fatty acids, multivitamin with minerals, glucose blood, atenolol, HUMULIN R U-500 KWIKPEN, HEALTHY ACCENTS UNIFINE PENTIP, amLODipine, losartan, and fenofibrate.  No orders of the defined types were placed in this encounter.   There are no discontinued medications.  Follow-up: Return in about 3 months (around 11/10/2016) for follow up diabetes.   Crecencio Mc, MD

## 2016-08-15 NOTE — Assessment & Plan Note (Signed)
Patient now seeing Nephrology,  Med list reviewed, no meds that are C/I

## 2016-08-15 NOTE — Assessment & Plan Note (Signed)
Managed with fenofibrate only due to concerns about statins. Her 10 year risk is 16%  .  Will recommend statin therapy  Lab Results  Component Value Date   CHOL 232 (H) 08/12/2016   HDL 42.70 08/12/2016   LDLCALC 158 (H) 08/12/2016   LDLDIRECT 154.0 02/02/2016   TRIG 160.0 (H) 08/12/2016   CHOLHDL 5 08/12/2016

## 2016-08-15 NOTE — Assessment & Plan Note (Signed)
I have addressed  BMI and recommended that patient start exercising with a goal of 30 minutes of aerobic exercise a minimum of 5 days per week.

## 2016-08-15 NOTE — Assessment & Plan Note (Addendum)
Labile sugars with no real causality after reviewing  her diet.  a1c has improved.  She has been urged to start exercising daily and start checking 2 hour post prandials dinners .  Lab Results  Component Value Date   HGBA1C 7.1 (H) 08/12/2016   Lab Results  Component Value Date   MICROALBUR 6.2 (H) 05/06/2016

## 2016-08-31 ENCOUNTER — Other Ambulatory Visit: Payer: Self-pay | Admitting: Internal Medicine

## 2016-10-16 ENCOUNTER — Other Ambulatory Visit: Payer: Self-pay | Admitting: Internal Medicine

## 2016-10-27 ENCOUNTER — Telehealth: Payer: Self-pay | Admitting: Internal Medicine

## 2016-10-27 ENCOUNTER — Encounter: Payer: Self-pay | Admitting: Emergency Medicine

## 2016-10-27 ENCOUNTER — Emergency Department
Admission: EM | Admit: 2016-10-27 | Discharge: 2016-10-27 | Disposition: A | Discharge status: Home / Self Care | Payer: BLUE CROSS/BLUE SHIELD | Attending: Emergency Medicine | Admitting: Emergency Medicine

## 2016-10-27 ENCOUNTER — Emergency Department: Payer: BLUE CROSS/BLUE SHIELD

## 2016-10-27 DIAGNOSIS — I129 Hypertensive chronic kidney disease with stage 1 through stage 4 chronic kidney disease, or unspecified chronic kidney disease: Secondary | ICD-10-CM | POA: Diagnosis not present

## 2016-10-27 DIAGNOSIS — R42 Dizziness and giddiness: Secondary | ICD-10-CM

## 2016-10-27 DIAGNOSIS — Y939 Activity, unspecified: Secondary | ICD-10-CM | POA: Insufficient documentation

## 2016-10-27 DIAGNOSIS — X58XXXA Exposure to other specified factors, initial encounter: Secondary | ICD-10-CM | POA: Insufficient documentation

## 2016-10-27 DIAGNOSIS — N183 Chronic kidney disease, stage 3 (moderate): Secondary | ICD-10-CM | POA: Insufficient documentation

## 2016-10-27 DIAGNOSIS — R112 Nausea with vomiting, unspecified: Secondary | ICD-10-CM | POA: Diagnosis not present

## 2016-10-27 DIAGNOSIS — Y999 Unspecified external cause status: Secondary | ICD-10-CM | POA: Diagnosis not present

## 2016-10-27 DIAGNOSIS — S0992XA Unspecified injury of nose, initial encounter: Secondary | ICD-10-CM | POA: Diagnosis present

## 2016-10-27 DIAGNOSIS — R51 Headache: Secondary | ICD-10-CM | POA: Diagnosis not present

## 2016-10-27 DIAGNOSIS — E1122 Type 2 diabetes mellitus with diabetic chronic kidney disease: Secondary | ICD-10-CM | POA: Insufficient documentation

## 2016-10-27 DIAGNOSIS — S0031XA Abrasion of nose, initial encounter: Secondary | ICD-10-CM | POA: Diagnosis not present

## 2016-10-27 DIAGNOSIS — Y92009 Unspecified place in unspecified non-institutional (private) residence as the place of occurrence of the external cause: Secondary | ICD-10-CM | POA: Diagnosis not present

## 2016-10-27 DIAGNOSIS — Z79899 Other long term (current) drug therapy: Secondary | ICD-10-CM | POA: Diagnosis not present

## 2016-10-27 DIAGNOSIS — R55 Syncope and collapse: Secondary | ICD-10-CM | POA: Insufficient documentation

## 2016-10-27 LAB — GLUCOSE, CAPILLARY: Glucose-Capillary: 167 mg/dL — ABNORMAL HIGH (ref 65–99)

## 2016-10-27 LAB — CBC
HEMATOCRIT: 40.5 % (ref 35.0–47.0)
HEMOGLOBIN: 13.8 g/dL (ref 12.0–16.0)
MCH: 29 pg (ref 26.0–34.0)
MCHC: 34.1 g/dL (ref 32.0–36.0)
MCV: 85.2 fL (ref 80.0–100.0)
Platelets: 286 10*3/uL (ref 150–440)
RBC: 4.75 MIL/uL (ref 3.80–5.20)
RDW: 13.9 % (ref 11.5–14.5)
WBC: 13.6 10*3/uL — AB (ref 3.6–11.0)

## 2016-10-27 LAB — BASIC METABOLIC PANEL
ANION GAP: 8 (ref 5–15)
BUN: 22 mg/dL — ABNORMAL HIGH (ref 6–20)
CO2: 26 mmol/L (ref 22–32)
Calcium: 9.3 mg/dL (ref 8.9–10.3)
Chloride: 105 mmol/L (ref 101–111)
Creatinine, Ser: 0.95 mg/dL (ref 0.44–1.00)
GFR calc Af Amer: >60 mL/min (ref >60)
Glucose, Bld: 186 mg/dL — ABNORMAL HIGH (ref 65–99)
POTASSIUM: 4.4 mmol/L (ref 3.5–5.1)
SODIUM: 139 mmol/L (ref 135–145)

## 2016-10-27 LAB — TROPONIN I

## 2016-10-27 MED ORDER — SODIUM CHLORIDE 0.9 % IV SOLN
Freq: Once | INTRAVENOUS | Status: AC
  Administered 2016-10-27: 21:00:00 via INTRAVENOUS

## 2016-10-27 MED ORDER — DIAZEPAM 5 MG PO TABS: ORAL_TABLET | ORAL | 0 refills | Status: DC

## 2016-10-27 MED ORDER — MECLIZINE HCL 25 MG PO TABS
50.0000 mg | ORAL_TABLET | Freq: Once | ORAL | Status: AC
  Administered 2016-10-27: 50 mg via ORAL
  Filled 2016-10-27 (×2): qty 2

## 2016-10-27 MED ORDER — DIAZEPAM 5 MG PO TABS
5.0000 mg | ORAL_TABLET | Freq: Once | ORAL | Status: AC
  Administered 2016-10-27: 5 mg via ORAL
  Filled 2016-10-27: qty 1

## 2016-10-27 MED ORDER — MECLIZINE HCL 25 MG PO TABS: 25.0000 mg | ORAL_TABLET | Freq: Three times a day (TID) | ORAL | 1 refills | Status: DC | PRN

## 2016-10-27 MED ORDER — PROMETHAZINE HCL 12.5 MG PO TABS: 12.5000 mg | ORAL_TABLET | Freq: Three times a day (TID) | ORAL | 0 refills | Status: DC | PRN

## 2016-10-27 NOTE — Telephone Encounter (Signed)
Reason for call: nausea and dizziness  Symptoms: diarrhea this am, nausea, dizziness, Blood sugar 201 , 0515 am juice, 0630 blood sugar 312 , 1015 blood sugar 214 pp, , hot and cold spells, denies chest pain, arm numbness, BP 158/89 Duration this am Medications:regular medicines Would like something for nausea please advise.

## 2016-10-27 NOTE — ED Provider Notes (Signed)
[facility] Emergency Department Provider Note        Time seen: ----------------------------------------- 8:39 PM on 10/27/2016 -----------------------------------------    I have reviewed the triage vital signs and the nursing notes.   HISTORY  Chief Complaint Dizziness; Nausea; and Emesis    HPI Pamela Harris is a 61 y.o. female who presents to ER for dizziness. Patient states she woke this morning with nausea, vomiting and dizziness. Patient describes sensation that she is spinning. Symptoms have worsened throughout the day, approximately at 6:25 PM she had a simple episode where she blacked out. Patient describes sitting on the side of the couch feeling nauseous like she would throw up just before passing out. She was noted to have an abrasion across the bridge of her nose from her glasses. She denies any headache to me.   Past Medical History:  Diagnosis Date  . Cancer (HCC)    basil cell  . Diabetes mellitus   . Hyperlipidemia   . Hypertension     Patient Active Problem List   Diagnosis Date Noted  . Hearing loss 07/13/2015  . Diabetic retinopathy ([place]) 07/11/2015  . S/P bilateral cataract extraction 04/10/2015  . Colon polyp, hyperplastic 04/10/2015  . Proliferative diabetic retinopathy ([place]) 10/24/2014  . Dysplastic nevus 07/04/2014  . CKD stage 3 due to type 2 diabetes mellitus ([place]) 03/31/2014  . Perimenopause 10/14/2013  . Statin intolerance 10/14/2013  . Encounter for preventive health examination 12/29/2012  . Obesity (BMI 30-39.9) 03/26/2012  . DM (diabetes mellitus), type 2, uncontrolled, with renal complications ([place]) 94/17/4081  . Hypercholesterolemia with hypertriglyceridemia 03/14/2007  . Essential hypertension 03/14/2007    Past Surgical History:  Procedure Laterality Date  . CLOSED REDUCTION ANKLE DISLOCATION Left   . EYE SURGERY    . MOUTH SURGERY      Allergies Atorvastatin; Codeine sulfate; Metformin; and  Zetia [ezetimibe]  Social History Social History  Substance Use Topics  . Smoking status: Never Smoker  . Smokeless tobacco: Never Used  . Alcohol use No    Review of Systems Constitutional: Negative for fever. Cardiovascular: Negative for chest pain. Respiratory: Negative for shortness of breath. Gastrointestinal: Negative for abdominal pain, Positive for nausea and vomiting Genitourinary: Negative for dysuria. Musculoskeletal: Negative for back pain. Skin: Positive for abrasion across the nasal bridge Neurological: Negative for headache, positive for dizziness  10-point ROS otherwise negative.  ____________________________________________   PHYSICAL EXAM:  VITAL SIGNS: ED Triage Vitals  Enc Vitals Group     BP 10/27/16 2000 135/72     Pulse Rate 10/27/16 2000 77     Resp 10/27/16 2000 16     Temp 10/27/16 2000 98 F (36.7 C)     Temp Source 10/27/16 2000 Oral     SpO2 10/27/16 2000 99 %     Weight 10/27/16 1958 214 lb (97.1 kg)     Height 10/27/16 1958 5\' 7"  (1.702 m)     Head Circumference --      Peak Flow --      Pain Score --      Pain Loc --      Pain Edu? --      Excl. in [place]? --     Constitutional: Alert and oriented. Well appearing and in no distress. Eyes: Conjunctivae are normal. PERRL. Normal extraocular movements. ENT   Head: Normocephalic and atraumatic.   Nose: No congestion/rhinnorhea.Mild abrasion across the nasal bridge   Mouth/Throat: Mucous membranes are moist.   Neck: No  stridor. Cardiovascular: Normal rate, regular rhythm. No murmurs, rubs, or gallops. Respiratory: Normal respiratory effort without tachypnea nor retractions. Breath sounds are clear and equal bilaterally. No wheezes/rales/rhonchi. Gastrointestinal: Soft and nontender. Normal bowel sounds Musculoskeletal: Nontender with normal range of motion in all extremities. No lower extremity tenderness nor edema. Neurologic:  Normal speech and language. No gross focal  neurologic deficits are appreciated.  Skin:  Skin is warm, dry and intact. No rash noted. Psychiatric: Mood and affect are normal. Speech and behavior are normal.  ____________________________________________  EKG: Interpreted by me. Sinus rhythm rate of 73 bpm, prolonged PR interval, normal QRS, normal QT, normal axis.  ____________________________________________  ED COURSE:  Pertinent labs & imaging results that were available during my care of the patient were reviewed by me and considered in my medical decision making (see chart for details). Patient presents to ER with symptoms of vertigo and likely vasovagal syncope. We will assess with labs and imaging.   Procedures ____________________________________________   LABS (pertinent positives/negatives)  Labs Reviewed  BASIC METABOLIC PANEL - Abnormal; Notable for the following:       Result Value   Glucose, Bld 186 (*)    BUN 22 (*)    All other components within normal limits  CBC - Abnormal; Notable for the following:    WBC 13.6 (*)    All other components within normal limits  GLUCOSE, CAPILLARY - Abnormal; Notable for the following:    Glucose-Capillary 167 (*)    All other components within normal limits  TROPONIN I  URINALYSIS, COMPLETE (UACMP) WITH MICROSCOPIC  CBG MONITORING, ED    RADIOLOGY Images were viewed by me  CT head IMPRESSION: 1. Normal CT appearance of the brain for age. 2. Atherosclerosis. 3. Mild degenerative changes in the cervical spine without evidence for acute trauma. ____________________________________________  FINAL ASSESSMENT AND PLAN  Vertigo, syncope  Plan: Patient with labs and imaging as dictated above. Patient is in no acute distress and her symptoms have resolved. She'll be discharged with meclizine and Valium and referred to ENT for outpatient follow-up.   Earleen Newport, MD   Note: This note was generated in part or whole with voice recognition software. Voice  recognition is usually quite accurate but there are transcription errors that can and very often do occur. I apologize for any typographical errors that were not detected and corrected.     Earleen Newport, MD 10/27/16 2219

## 2016-10-27 NOTE — Telephone Encounter (Signed)
This could be the flu.  I will call in med for nausea,  But find out if she is having fevers,  I will add tamiflu if so.

## 2016-10-27 NOTE — ED Triage Notes (Signed)
Pt to triage in wheelchair due to dizziness. Pt reports she awoke this AM with N/V and dizziness, worsened throughout day. This afternoon at approximately 1825 pt had a syncopal episode at home where she "blacked out," came to and was lying on carpeted floor, pt unknown if head was impacted. Pt has abrasion to bridge of nose and c/o of right sided head pain.

## 2016-10-27 NOTE — Telephone Encounter (Signed)
Spoke with patient  Her was temperature 96.1 12:10pm , 96.1 at 5:50 am.  Patient states dizziness and nausea seems to go together.   Blood pressure 125/69.   Patient denies headache and achiness  . She states she had flu shot.  Still feels nauseated.  Please advise.

## 2016-10-27 NOTE — Telephone Encounter (Signed)
Patients husband advised of below and verbalized an understanding.

## 2016-10-27 NOTE — Telephone Encounter (Signed)
Pt spouse called and stated that pt has been c/o dizziness, cold, clammy, diarrhea, vomiting,chills, bp was 158/89, glucose 214 at 10:15 pt has 85 units of insulin. Please advise, thank you!  Call pt @ [phone]

## 2016-10-27 NOTE — Telephone Encounter (Signed)
without fevers,  Will treat for viral gastroenteritis, not flu. Phenergan called in.

## 2016-11-12 DIAGNOSIS — H35373 Puckering of macula, bilateral: Secondary | ICD-10-CM | POA: Diagnosis not present

## 2016-11-12 LAB — HM DIABETES EYE EXAM

## 2016-11-16 DIAGNOSIS — R42 Dizziness and giddiness: Secondary | ICD-10-CM | POA: Diagnosis not present

## 2016-11-16 DIAGNOSIS — H903 Sensorineural hearing loss, bilateral: Secondary | ICD-10-CM | POA: Diagnosis not present

## 2016-11-17 ENCOUNTER — Ambulatory Visit (INDEPENDENT_AMBULATORY_CARE_PROVIDER_SITE_OTHER): Payer: BLUE CROSS/BLUE SHIELD | Admitting: Internal Medicine

## 2016-11-17 ENCOUNTER — Encounter: Payer: Self-pay | Admitting: Internal Medicine

## 2016-11-17 VITALS — BP 110/62 | HR 91 | Temp 98.1°F | Resp 15 | Ht 67.0 in | Wt 214.4 lb

## 2016-11-17 DIAGNOSIS — E1121 Type 2 diabetes mellitus with diabetic nephropathy: Secondary | ICD-10-CM | POA: Diagnosis not present

## 2016-11-17 DIAGNOSIS — R0683 Snoring: Secondary | ICD-10-CM

## 2016-11-17 DIAGNOSIS — E1159 Type 2 diabetes mellitus with other circulatory complications: Secondary | ICD-10-CM

## 2016-11-17 DIAGNOSIS — I152 Hypertension secondary to endocrine disorders: Secondary | ICD-10-CM

## 2016-11-17 DIAGNOSIS — R42 Dizziness and giddiness: Secondary | ICD-10-CM

## 2016-11-17 DIAGNOSIS — Z794 Long term (current) use of insulin: Secondary | ICD-10-CM

## 2016-11-17 DIAGNOSIS — R400 Somnolence: Secondary | ICD-10-CM

## 2016-11-17 DIAGNOSIS — H903 Sensorineural hearing loss, bilateral: Secondary | ICD-10-CM | POA: Diagnosis not present

## 2016-11-17 DIAGNOSIS — I10 Essential (primary) hypertension: Secondary | ICD-10-CM

## 2016-11-17 DIAGNOSIS — E119 Type 2 diabetes mellitus without complications: Secondary | ICD-10-CM

## 2016-11-17 DIAGNOSIS — IMO0002 Reserved for concepts with insufficient information to code with codable children: Secondary | ICD-10-CM

## 2016-11-17 DIAGNOSIS — N183 Chronic kidney disease, stage 3 (moderate): Secondary | ICD-10-CM

## 2016-11-17 DIAGNOSIS — E1165 Type 2 diabetes mellitus with hyperglycemia: Secondary | ICD-10-CM | POA: Diagnosis not present

## 2016-11-17 DIAGNOSIS — E1122 Type 2 diabetes mellitus with diabetic chronic kidney disease: Secondary | ICD-10-CM | POA: Diagnosis not present

## 2016-11-17 LAB — POCT GLYCOSYLATED HEMOGLOBIN (HGB A1C): Hemoglobin A1C: 7.5

## 2016-11-17 MED ORDER — HYDROCHLOROTHIAZIDE 25 MG PO TABS: 25.0000 mg | ORAL_TABLET | Freq: Every day | ORAL | 3 refills | Status: AC

## 2016-11-17 NOTE — Progress Notes (Signed)
Pre visit review using our clinic review tool, if applicable. No additional management support is needed unless otherwise documented below in the visit note. 

## 2016-11-17 NOTE — Progress Notes (Signed)
Subjective:  Patient ID: Pamela Harris, female    DOB: 1956-06-21  Age: 61 y.o. MRN: [id number]  CC: The primary encounter diagnosis was Diabetes mellitus without complication ([place]). Diagnoses of Hypertension associated with diabetes ([place]), Vertigo, Snoring, Essential hypertension, Uncontrolled type 2 diabetes mellitus with diabetic nephropathy, with long-term current use of insulin ([place]), CKD stage 3 due to type 2 diabetes mellitus ([place]), Sensorineural hearing loss (SNHL) of both ears, and Daytime somnolence were also pertinent to this visit.  HPI [name] presents for 3 MONTH follow up on TYPE 2 DM   Since her hast visit she had an ER visit for managent of a 12 hour episode of vertigo accompanied by nausea and vomiting which finally resulted in syncope while seated on March 7th.  ER records reviewed,  Head CT was done which was negative for hemorrhage and cva. Was discharged with meclizine and valium and referred to ENT with vertigo provoking measures resulting in  diaphoresis and tachycardia   Dx with a  vasavagal episode  Aggravated by anti hypertensive medications.  However no orthostatic vital signs were done by either evaluating physician and there was no documentation of hypotension   Recalls having had a similar episode after receiving avastin during eye surgery in 2010   Also recalls that the symptoms were similar to those  Felt during self described panic attacks  that have been occurring intermittently since 2011 . Occurred after both cataract surgeries, on the way home.  Aggravated by motion of car.  . Most episodes have been provoked by anesthesias, motion or activities involving the head including dental procedures.  She has had MRI of brain in the past. Last MRI of brain was done at [facility] in 2008  Of brain and orbits due to finding of ischemic optic neuropathy , to rule out venous sinus thrombosis . [name]   Has mutliple signs and symptoms of OSA with no prior testing,   Including fluid retention in lower extremities,  daytime somnolence, snoring,  obesity, and poor sleep quality with morning fatigue.   Hypertension:  She stopped atenolol (which she has been taking for years without trouble ) about 6 weeks ago due to a having an isolated episode of hives and attributing her persistent fluid retention to the medication.  She has continued taking amlodipine and notes no change in her fluid retention.   discussed stopping amlodipine,continuing losartan and starting hctz  For  persistent fluid retention. If she develops another episodes of hives,  Will stop losartan   DM:  Blood sugars have been periodically labile for up to 3 days at a tie before normalizing.  Denies any use of steroids.  Has changed her eating habits and is following the meal plan published by the ADA , eating her heaviest meal during the noontime hours.  Did not bring a log of her sugars.  .  Has checked a 2 am blood sugar which was 106  followed by a repeat check At 7 am  Which was 248 .   Insulin regimen :  She uses  U 500 pre meal tid:    65   65 and 75 .  Has increased mid day dose since largest meal is at lunch.  Not exercising or even walking on a daily basis   Obesity:  Weight unchanged for the past year.    Lab Results  Component Value Date   HGBA1C 7.5 11/17/2016     Outpatient Medications Prior to Visit  Medication Sig Dispense Refill  . amLODipine (NORVASC) 10 MG tablet TAKE ONE TABLET BY MOUTH DAILY  90 tablet 2  . atenolol (TENORMIN) 50 MG tablet Take 1 tablet (50 mg) by mouth daily. 90 tablet 0  . diazepam (VALIUM) 5 MG tablet For vertigo, to be taken with meclizine 20 tablet 0  . fenofibrate 160 MG tablet TAKE ONE TABLET BY MOUTH DAILY  90 tablet 2  . fish oil-omega-3 fatty acids 1000 MG capsule Take 1 g by mouth 2 (two) times daily.    [name] glucose blood (ACCU-CHEK AVIVA) test strip Check sugars three times daily e11.29 306 each 2  . HEALTHY ACCENTS UNIFINE PENTIP 31G X 8 MM  MISC USE AS DIRECTED, ONE THREE TIMES DAILY  100 each 11  . HUMULIN R U-500 KWIKPEN 500 UNIT/ML kwikpen inject 75 units at breakfast, 65 units at lunch, and 75 units with supper..max daily dose= 215 units 18 mL 3  . losartan (COZAAR) 100 MG tablet TAKE ONE TABLET BY MOUTH DAILY  90 tablet 2  . meclizine (ANTIVERT) 25 MG tablet Take 1 tablet (25 mg total) by mouth 3 (three) times daily as needed for dizziness or nausea. 30 tablet 1  . Probiotic Product (PROBIOTIC MATURE ADULT) CAPS Take 1 capsule by mouth daily.    . promethazine (PHENERGAN) 12.5 MG tablet Take 1 tablet (12.5 mg total) by mouth every 8 (eight) hours as needed for nausea or vomiting. 20 tablet 0  . Multiple Vitamins-Minerals (MULTIVITAMIN WITH MINERALS) tablet Take 1 tablet by mouth daily.     No facility-administered medications prior to visit.     Review of Systems;  Patient denies headache, fevers, malaise, unintentional weight loss, skin rash, eye pain, sinus congestion and sinus pain, sore throat, dysphagia,  hemoptysis , cough, dyspnea, wheezing, chest pain, palpitations, orthopnea, edema, abdominal pain, nausea, melena, diarrhea, constipation, flank pain, dysuria, hematuria, urinary  Frequency, nocturia, numbness, tingling, seizures,  Focal weakness, Loss of consciousness,  Tremor, insomnia, depression, anxiety, and suicidal ideation.      Objective:  BP 110/62   Pulse 91   Temp 98.1 F (36.7 C) (Oral)   Resp 15   Ht 5\' 7"  (1.702 m)   Wt 214 lb 6.4 oz (97.3 kg)   LMP 01/29/2014 (Approximate)   SpO2 98%   BMI 33.58 kg/m   BP Readings from Last 3 Encounters:  11/17/16 110/62  10/27/16 131/66  08/12/16 112/82    Wt Readings from Last 3 Encounters:  11/17/16 214 lb 6.4 oz (97.3 kg)  10/27/16 214 lb (97.1 kg)  08/12/16 214 lb (97.1 kg)    General appearance: alert, cooperative and appears stated age Ears: normal TM's and external ear canals both ears Throat: lips, mucosa, and tongue normal; teeth and gums  normal Neck: no adenopathy, no carotid bruit, supple, symmetrical, trachea midline and thyroid not enlarged, symmetric, no tenderness/mass/nodules Back: symmetric, no curvature. ROM normal. No CVA tenderness. Lungs: clear to auscultation bilaterally Heart: regular rate and rhythm, S1, S2 normal, no murmur, click, rub or gallop Abdomen: soft, non-tender; bowel sounds normal; no masses,  no organomegaly Pulses: 2+ and symmetric Skin: Skin color, texture, turgor normal. No rashes or lesions Lymph nodes: Cervical, supraclavicular, and axillary nodes normal.  Lab Results  Component Value Date   HGBA1C 7.5 11/17/2016   HGBA1C 7.1 (H) 08/12/2016   HGBA1C 7.4 (H) 05/06/2016    Lab Results  Component Value Date   CREATININE 0.95 10/27/2016   CREATININE 1.14 08/12/2016  CREATININE 0.93 05/06/2016    Lab Results  Component Value Date   WBC 13.6 (H) 10/27/2016   HGB 13.8 10/27/2016   HCT 40.5 10/27/2016   PLT 286 10/27/2016   GLUCOSE 186 (H) 10/27/2016   CHOL 232 (H) 08/12/2016   TRIG 160.0 (H) 08/12/2016   HDL 42.70 08/12/2016   LDLDIRECT 154.0 02/02/2016   LDLCALC 158 (H) 08/12/2016   ALT 18 08/12/2016   AST 16 08/12/2016   NA 139 10/27/2016   K 4.4 10/27/2016   CL 105 10/27/2016   CREATININE 0.95 10/27/2016   BUN 22 (H) 10/27/2016   CO2 26 10/27/2016   TSH 1.87 02/02/2016   HGBA1C 7.5 11/17/2016   MICROALBUR 6.2 (H) 05/06/2016    Ct Head Wo Contrast  Result Date: 10/27/2016 CLINICAL DATA:  New onset nausea, vomiting, and dizziness beginning today with progression through today. Syncopal episode at 6:25 today. Possible head trauma. Right-sided head pain. EXAM: CT HEAD WITHOUT CONTRAST CT CERVICAL SPINE WITHOUT CONTRAST TECHNIQUE: Multidetector CT imaging of the head and cervical spine was performed following the standard protocol without intravenous contrast. Multiplanar CT image reconstructions of the cervical spine were also generated. COMPARISON:  None. FINDINGS: CT HEAD  FINDINGS Brain: No acute infarct, hemorrhage, or mass lesion is present. No significant extraaxial fluid collection is present. The ventricles are of normal size. The brainstem and cerebellum are normal. Vascular: Faint calcifications are present within the cavernous internal carotid arteries. There is no hyperdense vessel. Skull: The calvarium is within normal limits. No acute fracture is present. Sinuses/Orbits: The paranasal sinuses and mastoid air cells are clear. Bilateral lens replacements are present. The globes and orbits are otherwise within normal limits. CT CERVICAL SPINE FINDINGS Alignment: AP alignment is anatomic. Skull base and vertebrae: The craniocervical junction is normal. Vertebral body heights are maintained. No acute fracture is present. Soft tissues and spinal canal: The visualized soft tissues the neck are unremarkable. The thyroid is within normal limits. No significant adenopathy is present. No focal intracanalicular lesions are present. Disc levels: Degenerative changes are most evident C5-6 and C6-7 or a disc osteophyte complex contributes to mild central canal narrowing. The foramina are patent. Upper chest: The lung apices are clear. IMPRESSION: 1. Normal CT appearance of the brain for age. 2. Atherosclerosis. 3. Mild degenerative changes in the cervical spine without evidence for acute trauma. Electronically Signed   By: San Morelle M.D.   On: 10/27/2016 20:56   Ct Cervical Spine Wo Contrast  Result Date: 10/27/2016 CLINICAL DATA:  New onset nausea, vomiting, and dizziness beginning today with progression through today. Syncopal episode at 6:25 today. Possible head trauma. Right-sided head pain. EXAM: CT HEAD WITHOUT CONTRAST CT CERVICAL SPINE WITHOUT CONTRAST TECHNIQUE: Multidetector CT imaging of the head and cervical spine was performed following the standard protocol without intravenous contrast. Multiplanar CT image reconstructions of the cervical spine were also  generated. COMPARISON:  None. FINDINGS: CT HEAD FINDINGS Brain: No acute infarct, hemorrhage, or mass lesion is present. No significant extraaxial fluid collection is present. The ventricles are of normal size. The brainstem and cerebellum are normal. Vascular: Faint calcifications are present within the cavernous internal carotid arteries. There is no hyperdense vessel. Skull: The calvarium is within normal limits. No acute fracture is present. Sinuses/Orbits: The paranasal sinuses and mastoid air cells are clear. Bilateral lens replacements are present. The globes and orbits are otherwise within normal limits. CT CERVICAL SPINE FINDINGS Alignment: AP alignment is anatomic. Skull base and vertebrae: The craniocervical  junction is normal. Vertebral body heights are maintained. No acute fracture is present. Soft tissues and spinal canal: The visualized soft tissues the neck are unremarkable. The thyroid is within normal limits. No significant adenopathy is present. No focal intracanalicular lesions are present. Disc levels: Degenerative changes are most evident C5-6 and C6-7 or a disc osteophyte complex contributes to mild central canal narrowing. The foramina are patent. Upper chest: The lung apices are clear. IMPRESSION: 1. Normal CT appearance of the brain for age. 2. Atherosclerosis. 3. Mild degenerative changes in the cervical spine without evidence for acute trauma. Electronically Signed   By: San Morelle M.D.   On: 10/27/2016 20:56    Assessment & Plan:   Problem List Items Addressed This Visit    CKD stage 3 due to type 2 diabetes mellitus [facility])    Patient now seeing Nephrology,  Med list reviewed, no meds that are C/I cr is stable   Lab Results  Component Value Date   CREATININE 0.95 10/27/2016         Daytime somnolence    Coupled with history of snoring, non restorative sleep and obesity and fluid retention.  Sleep study advised to rule out OSA      DM (diabetes mellitus), type  2, uncontrolled, with renal complications (HCC)    Loss of control noted.  Asked to focus on pre/post blood sugars on one meal per week to determine if her prandial insulin needs adjusting.    Lab Results  Component Value Date   HGBA1C 7.5 11/17/2016   Lab Results  Component Value Date   MICROALBUR 6.2 (H) 05/06/2016         Essential hypertension    She has stopped atenolol on her own due to presuming that her fluid retention and isolated episodes of hives was due to the medication. She has not had another episode.  discussed stopping amlodipine,continuing losartan and starting hctz  For  persistent fluid retention. If she develops another episodes of hives,  Will stop losartan   Lab Results  Component Value Date   CREATININE 0.95 10/27/2016   Lab Results  Component Value Date   NA 139 10/27/2016   K 4.4 10/27/2016   CL 105 10/27/2016   CO2 26 10/27/2016           Relevant Medications   hydrochlorothiazide (HYDRODIURIL) 25 MG tablet   Hearing loss    Given her history of hearing loss and recurrent vertigo,  I will recommend MRI brain to rule out acoustic neuroma.       Relevant Orders   MR BRAIN WO CONTRAST   Vertigo    Recent prolonged episode lasting 12 hours resulted in N/V and syncope while seated.   MRI brain ordered to rule out acoustic neuroma given recurrent episodes and recent hearing loss. Also referring to cardiology to rule out arryhthmia,  And sleep study to rule out OSA causing nocturnal hypoxia.        Relevant Orders   Ambulatory referral to Cardiology   MR BRAIN WO CONTRAST    Other Visit Diagnoses    Diabetes mellitus without complication ([place])    -  Primary   Relevant Orders   POCT HgB A1C (Completed)   Hypertension associated with diabetes ([place])       Relevant Medications   hydrochlorothiazide (HYDRODIURIL) 25 MG tablet   Other Relevant Orders   Basic metabolic panel   Polysomnography 4 or more parameters   Snoring  Relevant  Orders   Polysomnography 4 or more parameters      A total of 40 minutes was spent with patient more than half of which was spent in counseling patient on the above mentioned issues , reviewing and explaining recent labs and imaging studies done, and coordination of care.  I have discontinued Ms. Counts's multivitamin with minerals. I am also having her start on hydrochlorothiazide. Additionally, I am having her maintain her PROBIOTIC MATURE ADULT, fish oil-omega-3 fatty acids, glucose blood, atenolol, amLODipine, losartan, fenofibrate, HUMULIN R U-500 KWIKPEN, HEALTHY ACCENTS UNIFINE PENTIP, promethazine, diazepam, and meclizine.  Meds ordered this encounter  Medications  . hydrochlorothiazide (HYDRODIURIL) 25 MG tablet    Sig: Take 1 tablet (25 mg total) by mouth daily.    Dispense:  30 tablet    Refill:  3    Medications Discontinued During This Encounter  Medication Reason  . Multiple Vitamins-Minerals (MULTIVITAMIN WITH MINERALS) tablet Patient has not taken in last 30 days    Follow-up: Return in about 4 weeks (around 12/15/2016).   Crecencio Mc, MD

## 2016-11-17 NOTE — Patient Instructions (Addendum)
Stop the amlodipine Continue losartan Adding hctz 25 mg daily in the morning Return one week for bp check and blood test   Cardiology referral for holter monitor, possibly a stress test Sleep study to rule out sleep apnea   Focus on pre and post lunch  Sugars for one  Week,  Then pre and post dinner sugars for one week   No labs needed today

## 2016-11-18 ENCOUNTER — Other Ambulatory Visit: Payer: Self-pay

## 2016-11-18 MED ORDER — ACCU-CHEK SOFTCLIX LANCETS MISC: 12 refills | Status: AC

## 2016-11-19 DIAGNOSIS — R42 Dizziness and giddiness: Secondary | ICD-10-CM | POA: Insufficient documentation

## 2016-11-19 DIAGNOSIS — R400 Somnolence: Secondary | ICD-10-CM | POA: Insufficient documentation

## 2016-11-19 NOTE — Assessment & Plan Note (Addendum)
Coupled with history of snoring, non restorative sleep and obesity and fluid retention.  Sleep study advised to rule out OSA

## 2016-11-19 NOTE — Assessment & Plan Note (Signed)
Recent prolonged episode lasting 12 hours resulted in N/V and syncope while seated.   MRI brain ordered to rule out acoustic neuroma given recurrent episodes and recent hearing loss. Also referring to cardiology to rule out arryhthmia,  And sleep study to rule out OSA causing nocturnal hypoxia.

## 2016-11-19 NOTE — Assessment & Plan Note (Addendum)
She has stopped atenolol on her own due to presuming that her fluid retention and isolated episodes of hives was due to the medication. She has not had another episode.  discussed stopping amlodipine,continuing losartan and starting hctz  For  persistent fluid retention. If she develops another episodes of hives,  Will stop losartan   Lab Results  Component Value Date   CREATININE 0.95 10/27/2016   Lab Results  Component Value Date   NA 139 10/27/2016   K 4.4 10/27/2016   CL 105 10/27/2016   CO2 26 10/27/2016

## 2016-11-19 NOTE — Assessment & Plan Note (Signed)
Loss of control noted.  Asked to focus on pre/post blood sugars on one meal per week to determine if her prandial insulin needs adjusting.    Lab Results  Component Value Date   HGBA1C 7.5 11/17/2016   Lab Results  Component Value Date   MICROALBUR 6.2 (H) 05/06/2016

## 2016-11-19 NOTE — Assessment & Plan Note (Signed)
She has been Jordan her annual eye exam with Dr  Oval Linsey  at [facility] regularly

## 2016-11-19 NOTE — Assessment & Plan Note (Signed)
Given her history of hearing loss and recurrent vertigo,  I will recommend MRI brain to rule out acoustic neuroma.

## 2016-11-19 NOTE — Assessment & Plan Note (Signed)
Patient now seeing Nephrology,  Med list reviewed, no meds that are C/I cr is stable   Lab Results  Component Value Date   CREATININE 0.95 10/27/2016

## 2016-11-25 ENCOUNTER — Ambulatory Visit (INDEPENDENT_AMBULATORY_CARE_PROVIDER_SITE_OTHER): Payer: BLUE CROSS/BLUE SHIELD | Admitting: *Deleted

## 2016-11-25 ENCOUNTER — Telehealth: Payer: Self-pay | Admitting: Internal Medicine

## 2016-11-25 ENCOUNTER — Other Ambulatory Visit (INDEPENDENT_AMBULATORY_CARE_PROVIDER_SITE_OTHER): Payer: BLUE CROSS/BLUE SHIELD

## 2016-11-25 VITALS — BP 118/60 | HR 61 | Resp 14

## 2016-11-25 DIAGNOSIS — I10 Essential (primary) hypertension: Secondary | ICD-10-CM

## 2016-11-25 DIAGNOSIS — E1159 Type 2 diabetes mellitus with other circulatory complications: Secondary | ICD-10-CM

## 2016-11-25 DIAGNOSIS — I152 Hypertension secondary to endocrine disorders: Secondary | ICD-10-CM

## 2016-11-25 LAB — BASIC METABOLIC PANEL
BUN: 27 mg/dL — AB (ref 6–23)
CALCIUM: 9.8 mg/dL (ref 8.4–10.5)
CO2: 28 mEq/L (ref 19–32)
CREATININE: 1.13 mg/dL (ref 0.40–1.20)
Chloride: 102 mEq/L (ref 96–112)
GFR: 52.38 mL/min — ABNORMAL LOW (ref >60.00)
GLUCOSE: 131 mg/dL — AB (ref 70–99)
Potassium: 4.1 mEq/L (ref 3.5–5.1)
Sodium: 136 mEq/L (ref 135–145)

## 2016-11-25 MED ORDER — ALPRAZOLAM 0.5 MG PO TABS: ORAL_TABLET | ORAL | 0 refills | Status: DC

## 2016-11-25 NOTE — Telephone Encounter (Signed)
Alprazolam 0.5 mg to be sent to pharmacy ,  rx printed

## 2016-11-25 NOTE — Telephone Encounter (Signed)
Patient having MRI in the morning and asking for something to help her relax she has a fear of tight places, MRI scheduled for 8:30 11/26/16

## 2016-11-25 NOTE — Progress Notes (Signed)
Patient presented for Blood pressure check per refill note and labs, BP attained in left arm 118/70 pulse 62, pressure were attained by nursing standards for BP, Right arm BP 118/60 pulse 61 Patient taking losartan 100 mg daily. Patient also taking HCTZ 25 mg added at visit 11/17/16 and amlodipine and atenolol were stopped. Med list updated.

## 2016-11-25 NOTE — Telephone Encounter (Signed)
Notified patient and faxed script to pharmacy as requested.

## 2016-11-26 ENCOUNTER — Ambulatory Visit
Admission: RE | Admit: 2016-11-26 | Discharge: 2016-11-26 | Disposition: A | Discharge status: Home / Self Care | Payer: BLUE CROSS/BLUE SHIELD | Source: Ambulatory Visit | Attending: Internal Medicine | Admitting: Internal Medicine

## 2016-11-26 DIAGNOSIS — H903 Sensorineural hearing loss, bilateral: Secondary | ICD-10-CM

## 2016-11-26 DIAGNOSIS — R42 Dizziness and giddiness: Secondary | ICD-10-CM

## 2016-11-30 DIAGNOSIS — R42 Dizziness and giddiness: Secondary | ICD-10-CM | POA: Diagnosis not present

## 2016-12-01 DIAGNOSIS — R42 Dizziness and giddiness: Secondary | ICD-10-CM | POA: Diagnosis not present

## 2016-12-07 ENCOUNTER — Ambulatory Visit
Admission: RE | Admit: 2016-12-07 | Discharge: 2016-12-07 | Disposition: A | Discharge status: Home / Self Care | Payer: BLUE CROSS/BLUE SHIELD | Source: Ambulatory Visit | Attending: Internal Medicine | Admitting: Internal Medicine

## 2016-12-07 DIAGNOSIS — H903 Sensorineural hearing loss, bilateral: Secondary | ICD-10-CM | POA: Insufficient documentation

## 2016-12-07 DIAGNOSIS — R42 Dizziness and giddiness: Secondary | ICD-10-CM | POA: Diagnosis not present

## 2016-12-13 NOTE — Progress Notes (Signed)
Cardiology Office Note  Date:  12/14/2016   ID:  XITLALLY MOONEYHAM, DOB [birth year], MRN [id number]  PCP:  Crecencio Mc, MD   Chief Complaint  Patient presents with  . other    New patient. Referred by Dr. Derrel Nip. Patient denies chest pain and SOB. Meds reviewed verbally with patient.     HPI:  61 year old woman with history of  Diabetes mellitus , HBA1C 7.5 Hypertension  Leg swelling, on amlodipine Vertigo,  obesity CKD stage 3 due to type 2 diabetes mellitus (HCC),  Sensorineural hearing loss (SNHL) of both ears,  Daytime somnolence , OSA Who presents by referral from Dr. Derrel Nip for consultation of her dizziness  She reports having significant symptoms 10/27/2016 Woke up in the middle of the night to go to the bathroom, Balance was off Finally got to the bathroom, Then dizzy, Nausea, After using the restroom husband helped her to get to a recliner She took a nap Symptoms persisted Dizziness changed to lightheaded, worse While sitting, and with movement  Sitting off the couch she had nausea, unclear if she is vomiting , developed syncope Woke on the floor  Got back on the couch for most of the day and evening She took some Nausea medication that symptoms persisted  She went to the ER 10/27/2016 Lab work reviewed showing no acute abnormalities, normal EKG with sinus rhythm Was discharged home  March 27 she had appointment with ear nose throat She did several tests, was told she did not have vertigo Driving home from her appointment she had more dizziness, nausea Had to stop the car, wait for symptoms to resolve  On her last visit with primary care, she had some mild leg swelling Amlodipine was held and she was started on HCTZ, leg swelling has resolved  Lab work reviewed with her in detail Total cholesterol 232, reports having some intolerance to statin in the past Hemoglobin A1c 7.5  EKG on today's visit shows normal sinus rhythm with rate 67 bpm no significant ST  or T-wave changes  Other past medical history reviewed  Normal echo 2007 Normal CT chest 2006  PMH:   has a past medical history of Cancer ([place]); Diabetes mellitus; Hyperlipidemia; and Hypertension.  PSH:    Past Surgical History:  Procedure Laterality Date  . CLOSED REDUCTION ANKLE DISLOCATION Left   . EYE SURGERY    . MOUTH SURGERY      Current Outpatient Prescriptions  Medication Sig Dispense Refill  . ACCU-CHEK SOFTCLIX LANCETS lancets Use as instructed 100 each 12  . fenofibrate 160 MG tablet TAKE ONE TABLET BY MOUTH DAILY  90 tablet 2  . fish oil-omega-3 fatty acids 1000 MG capsule Take 1 g by mouth 2 (two) times daily.    [name] glucose blood (ACCU-CHEK AVIVA) test strip Check sugars three times daily e11.29 306 each 2  . HEALTHY ACCENTS UNIFINE PENTIP 31G X 8 MM MISC USE AS DIRECTED, ONE THREE TIMES DAILY  100 each 11  . HUMULIN R U-500 KWIKPEN 500 UNIT/ML kwikpen INJECT 75 UNITS AT BREAKFAST, 65 UNITS AT LUNCH AND 75 UNITS WITH SUPPER (MAX DAILY DOSE 215 UNITS) 18 mL 2  . hydrochlorothiazide (HYDRODIURIL) 25 MG tablet Take 1 tablet (25 mg total) by mouth daily. 30 tablet 3  . losartan (COZAAR) 100 MG tablet TAKE ONE TABLET BY MOUTH DAILY  90 tablet 2  . Probiotic Product (PROBIOTIC MATURE ADULT) CAPS Take 1 capsule by mouth daily.     No current facility-administered medications  for this visit.      Allergies:   Atorvastatin; Codeine sulfate; Metformin; and Zetia [ezetimibe]   Social History:  The patient  reports that she has never smoked. She has never used smokeless tobacco. She reports that she does not drink alcohol or use drugs.   Family History:   family history includes Breast cancer in her paternal aunt; Cancer in her maternal aunt; Coronary artery disease in her father; Diabetes in her father and mother; Heart disease in her father, maternal grandfather, mother, and paternal grandfather; Hypertension in her mother; Osteoporosis in her mother; Stroke in her mother.     Review of Systems: Review of Systems  Constitutional: Negative.        Lightheaded  Respiratory: Negative.   Cardiovascular: Negative.   Gastrointestinal: Negative.   Musculoskeletal: Negative.   Neurological: Positive for dizziness.  Psychiatric/Behavioral: Negative.   All other systems reviewed and are negative.    PHYSICAL EXAM: VS:  BP 130/64 (BP Location: Right Arm, Patient Position: Sitting, Cuff Size: Large)   Pulse 67   Ht 5\' 7"  (1.702 m)   Wt 216 lb (98 kg)   LMP 01/29/2014 (Approximate)   BMI 33.83 kg/m  , BMI Body mass index is 33.83 kg/m. GEN: Well nourished, well developed, in no acute distress , obese HEENT: normal  Neck: no JVD, carotid bruits, or masses Cardiac: RRR; no murmurs, rubs, or gallops,no edema  Respiratory:  clear to auscultation bilaterally, normal work of breathing GI: soft, nontender, nondistended, + BS MS: no deformity or atrophy  Skin: warm and dry, no rash Neuro:  Strength and sensation are intact Psych: euthymic mood, full affect    Recent Labs: 02/02/2016: TSH 1.87 08/12/2016: ALT 18 10/27/2016: Hemoglobin 13.8; Platelets 286 11/25/2016: BUN 27; Creatinine, Ser 1.13; Potassium 4.1; Sodium 136    Lipid Panel Lab Results  Component Value Date   CHOL 232 (H) 08/12/2016   HDL 42.70 08/12/2016   LDLCALC 158 (H) 08/12/2016   TRIG 160.0 (H) 08/12/2016      Wt Readings from Last 3 Encounters:  12/14/16 216 lb (98 kg)  11/17/16 214 lb 6.4 oz (97.3 kg)  10/27/16 214 lb (97.1 kg)       ASSESSMENT AND PLAN:  Vertigo - Plan: EKG 12-Lead, Cardiac event monitor Was told by the emergency room that she had vertigo but she denies any symptoms concerning for vertigo/spinning. Sounds more like a dizziness from low blood pressure or arrhythmia Event monitor ordered as below  CKD stage 3 due to type 2 diabetes mellitus ([place]) - Plan: EKG 12-Lead, Cardiac event monitor Secondary to diabetes Stressed importance of aggressive diabetes  control  Obesity (BMI 30-39.9) - Plan: EKG 12-Lead, Cardiac event monitor We have encouraged continued exercise, careful diet management in an effort to lose weight.  Essential hypertension - Plan: EKG 12-Lead, Cardiac event monitor Blood pressure is well controlled on today's visit. No changes made to the medications. She has follow-up with Dr. Derrel Nip  Hypercholesterolemia with hypertriglyceridemia - Plan: EKG 12-Lead, Cardiac event monitor Difficulty tolerating statin in the past Reported that it felt like a "vine wrapped around her leg"  We discussed  zetia. Lots of questions, not eager to start a medication   Syncope, unspecified syncope type - Plan: Cardiac event monitor Etiology of her syncope is unclear, possibly vasovagal as it happened in the setting of nausea, possible vomiting. We have recommended event monitor to rule out arrhythmia  Lightheadedness - Plan: Cardiac event monitor Several episodes of  lightheadedness both on March 7 and again on March 27 Unable to exclude arrhythmia Long discussion with her, event monitor ordered as above  Uncontrolled type 2 diabetes mellitus with diabetic nephropathy, with long-term current use of insulin ([place])  discussed various diets with her, low carbohydrate , Regular exercise program   Disposition:   F/U  as needed, we will call with the results of the event monitor   Patient seen in consultation for Dr. Derrel Nip  and will be referred back  for ongoing care of the medical issues detailed above.   Total encounter time more than 60 minutes  Greater than 50% was spent in counseling and coordination of care with the patient   Orders Placed This Encounter  Procedures  . Cardiac event monitor  . EKG 12-Lead     Signed, Esmond Plants, M.D., Ph.D. 12/14/2016  [place], [place]

## 2016-12-14 ENCOUNTER — Ambulatory Visit (INDEPENDENT_AMBULATORY_CARE_PROVIDER_SITE_OTHER): Payer: BLUE CROSS/BLUE SHIELD | Admitting: Cardiovascular Disease

## 2016-12-14 ENCOUNTER — Encounter: Payer: Self-pay | Admitting: Cardiovascular Disease

## 2016-12-14 ENCOUNTER — Other Ambulatory Visit: Payer: Self-pay | Admitting: Internal Medicine

## 2016-12-14 VITALS — BP 130/64 | HR 67 | Ht 67.0 in | Wt 216.0 lb

## 2016-12-14 DIAGNOSIS — E1122 Type 2 diabetes mellitus with diabetic chronic kidney disease: Secondary | ICD-10-CM | POA: Diagnosis not present

## 2016-12-14 DIAGNOSIS — N183 Chronic kidney disease, stage 3 unspecified: Secondary | ICD-10-CM

## 2016-12-14 DIAGNOSIS — IMO0002 Reserved for concepts with insufficient information to code with codable children: Secondary | ICD-10-CM

## 2016-12-14 DIAGNOSIS — E1121 Type 2 diabetes mellitus with diabetic nephropathy: Secondary | ICD-10-CM | POA: Diagnosis not present

## 2016-12-14 DIAGNOSIS — E782 Mixed hyperlipidemia: Secondary | ICD-10-CM

## 2016-12-14 DIAGNOSIS — I10 Essential (primary) hypertension: Secondary | ICD-10-CM

## 2016-12-14 DIAGNOSIS — Z794 Long term (current) use of insulin: Secondary | ICD-10-CM | POA: Diagnosis not present

## 2016-12-14 DIAGNOSIS — E1165 Type 2 diabetes mellitus with hyperglycemia: Secondary | ICD-10-CM

## 2016-12-14 DIAGNOSIS — R55 Syncope and collapse: Secondary | ICD-10-CM

## 2016-12-14 DIAGNOSIS — R42 Dizziness and giddiness: Secondary | ICD-10-CM | POA: Diagnosis not present

## 2016-12-14 DIAGNOSIS — E669 Obesity, unspecified: Secondary | ICD-10-CM | POA: Diagnosis not present

## 2016-12-14 NOTE — Patient Instructions (Addendum)
Medication Instructions:   No medication changes made  Ask Dr. Derrel Nip about Zetia for cholesterol  Labwork:  No new labs needed  Testing/Procedures:  We will order a 30 day monitor for syncope and lightheadedness You will receive a call from [employer] today to verify your address before they mail the monitor to your home It is very important that you answer this call After you receive the monitor, call the 1-800 # on the box and they will activate the monitor and walk you through putting it on   Follow-Up: It was a pleasure seeing you in the office today. Please call us if you have new issues that need to be addressed before your next appt.  [phone]  Your physician wants you to follow-up in:  We will call you with the result  If you need a refill on your cardiac medications before your next appointment, please call your pharmacy.     Cardiac Event Monitoring A cardiac event monitor is a small recording device that is used to detect abnormal heart rhythms (arrhythmias). The monitor is used to record your heart rhythm when you have symptoms, such as:  Fast heartbeats (palpitations), such as heart racing or fluttering.  Dizziness.  Fainting or light-headedness.  Unexplained weakness. Some monitors are wired to electrodes placed on your chest. Electrodes are flat, sticky disks that attach to your skin. Other monitors may be hand-held or worn on the wrist. The monitor can be worn for up to 30 days. If the monitor is attached to your chest, a technician will prepare your chest for the electrode placement and show you how to work the monitor. Take time to practice using the monitor before you leave the office. Make sure you understand how to send the information from the monitor to your health care provider. In some cases, you may need to use a landline telephone instead of a cell phone. What are the risks? Generally, this device is safe to use, but it possible that the skin  under the electrodes will become irritated. How to use your cardiac event monitor  Wear your monitor at all times, except when you are in water:  Do not let the monitor get wet.  Take the monitor off when you bathe. Do not swim or use a hot tub with it on.  Keep your skin clean. Do not put body lotion or moisturizer on your chest.  Change the electrodes as told by your health care provider or any time they stop sticking to your skin. You may need to use medical tape to keep them on.  Try to put the electrodes in slightly different places on your chest to help prevent skin irritation. They must remain in the area under your left breast and in the upper right section of your chest.  Make sure the monitor is safely clipped to your clothing or in a location close to your body that your health care provider recommends.  Press the button to record as soon as you feel heart-related symptoms, such as:  Dizziness.  Weakness.  Light-headedness.  Palpitations.  Thumping or pounding in your chest.  Shortness of breath.  Unexplained weakness.  Keep a diary of your activities, such as walking, doing chores, and taking medicine. It is very important to note what you were doing when you pushed the button to record your symptoms. This will help your health care provider determine what might be contributing to your symptoms.  Send the recorded information as recommended by  your health care provider. It may take some time for your health care provider to process the results.  Change the batteries as told by your health care provider.  Keep electronic devices away from your monitor. This includes:  Tablets.  MP3 players.  Cell phones.  While wearing your monitor you should avoid:  Electric blankets.  Armed forces operational officer.  Electric toothbrushes.  Microwave ovens.  Magnets.  Metal detectors. Get help right away if:  You have chest pain.  You have extreme difficulty breathing or  shortness of breath.  You develop a very fast heartbeat that persists.  You develop dizziness that does not go away.  You faint or constantly feel like you are about to faint. Summary  A cardiac event monitor is a small recording device that is used to help detect abnormal heart rhythms (arrhythmias).  The monitor is used to record your heart rhythm when you have heart-related symptoms.  Make sure you understand how to send the information from the monitor to your health care provider.  It is important to press the button on the monitor when you have any heart-related symptoms.  Keep a diary of your activities, such as walking, doing chores, and taking medicine. It is very important to note what you were doing when you pushed the button to record your symptoms. This will help your health care provider learn what might be causing your symptoms. This information is not intended to replace advice given to you by your health care provider. Make sure you discuss any questions you have with your health care provider. Document Released: 05/18/2008 Document Revised: 07/24/2016 Document Reviewed: 07/24/2016 Elsevier Interactive Patient Education  2017 [employer].

## 2016-12-21 ENCOUNTER — Encounter (INDEPENDENT_AMBULATORY_CARE_PROVIDER_SITE_OTHER): Payer: BLUE CROSS/BLUE SHIELD

## 2016-12-21 DIAGNOSIS — E669 Obesity, unspecified: Secondary | ICD-10-CM

## 2016-12-21 DIAGNOSIS — N183 Chronic kidney disease, stage 3 (moderate): Secondary | ICD-10-CM | POA: Diagnosis not present

## 2016-12-21 DIAGNOSIS — R55 Syncope and collapse: Secondary | ICD-10-CM

## 2016-12-21 DIAGNOSIS — E1122 Type 2 diabetes mellitus with diabetic chronic kidney disease: Secondary | ICD-10-CM | POA: Diagnosis not present

## 2016-12-21 DIAGNOSIS — E782 Mixed hyperlipidemia: Secondary | ICD-10-CM | POA: Diagnosis not present

## 2016-12-21 DIAGNOSIS — R42 Dizziness and giddiness: Secondary | ICD-10-CM

## 2016-12-21 DIAGNOSIS — I10 Essential (primary) hypertension: Secondary | ICD-10-CM

## 2016-12-31 ENCOUNTER — Encounter: Payer: Self-pay | Admitting: Internal Medicine

## 2016-12-31 ENCOUNTER — Ambulatory Visit (INDEPENDENT_AMBULATORY_CARE_PROVIDER_SITE_OTHER): Payer: BLUE CROSS/BLUE SHIELD | Admitting: Internal Medicine

## 2016-12-31 VITALS — BP 114/68 | HR 88 | Temp 98.3°F | Resp 15 | Ht 67.0 in | Wt 214.4 lb

## 2016-12-31 DIAGNOSIS — R600 Localized edema: Secondary | ICD-10-CM | POA: Diagnosis not present

## 2016-12-31 DIAGNOSIS — R55 Syncope and collapse: Secondary | ICD-10-CM

## 2016-12-31 DIAGNOSIS — E1122 Type 2 diabetes mellitus with diabetic chronic kidney disease: Secondary | ICD-10-CM | POA: Diagnosis not present

## 2016-12-31 DIAGNOSIS — E538 Deficiency of other specified B group vitamins: Secondary | ICD-10-CM | POA: Diagnosis not present

## 2016-12-31 DIAGNOSIS — I10 Essential (primary) hypertension: Secondary | ICD-10-CM | POA: Diagnosis not present

## 2016-12-31 DIAGNOSIS — N183 Chronic kidney disease, stage 3 (moderate): Secondary | ICD-10-CM | POA: Diagnosis not present

## 2016-12-31 DIAGNOSIS — E782 Mixed hyperlipidemia: Secondary | ICD-10-CM

## 2016-12-31 NOTE — Patient Instructions (Addendum)
Suspend the hctz unless you want to take it once in a while for fluid retention   check blood pressure next time you feel woozy. ,  If the top number  is < 120,  You may be experienecing orthostasis and we wil need to adjust your BP  Medications even more    Leg Stockings can be purchased at [email or web address]  For management of swelling due to venous insufficiency via compression   We wil check you B12 with your A1c AND other fasting labs AFTER June 28   CANCEL THE SLEEP STUDY

## 2016-12-31 NOTE — Progress Notes (Signed)
Subjective:  Patient ID: Pamela Harris, female    DOB: 09-26-1955  Age: 61 y.o. MRN: [id number]  CC: The primary encounter diagnosis was CKD stage 3 due to type 2 diabetes mellitus ([place]). Diagnoses of Hypercholesterolemia with hypertriglyceridemia, B12 deficiency, Essential hypertension, Syncope, unspecified syncope type, and Edema of both legs were also pertinent to this visit.  HPI Pamela Harris presents for follow up on dizziness and syncopal episode reported at last visit  Cardiology referral.  Event monitor ordered  Sleep study ordered, but not covered by insurance.   MRI Brain  Was Normal April 17  Sleep study denied coverage by insurance; they want a home study.  Snoring has imporved,  Sleeps on side chronically   No  Recent episodes., but has been getting light headed with position change   Outpatient Medications Prior to Visit  Medication Sig Dispense Refill  . ACCU-CHEK SOFTCLIX LANCETS lancets Use as instructed 100 each 12  . fenofibrate 160 MG tablet TAKE ONE TABLET BY MOUTH DAILY  90 tablet 2  . fish oil-omega-3 fatty acids 1000 MG capsule Take 1 g by mouth 2 (two) times daily.    [name] glucose blood (ACCU-CHEK AVIVA) test strip Check sugars three times daily e11.29 306 each 2  . HEALTHY ACCENTS UNIFINE PENTIP 31G X 8 MM MISC USE AS DIRECTED, ONE THREE TIMES DAILY  100 each 11  . HUMULIN R U-500 KWIKPEN 500 UNIT/ML kwikpen INJECT 75 UNITS AT BREAKFAST, 65 UNITS AT LUNCH AND 75 UNITS WITH SUPPER (MAX DAILY DOSE 215 UNITS) 18 mL 2  . hydrochlorothiazide (HYDRODIURIL) 25 MG tablet Take 1 tablet (25 mg total) by mouth daily. 30 tablet 3  . losartan (COZAAR) 100 MG tablet TAKE ONE TABLET BY MOUTH DAILY  90 tablet 2  . Probiotic Product (PROBIOTIC MATURE ADULT) CAPS Take 1 capsule by mouth daily.     No facility-administered medications prior to visit.     Review of Systems;  Patient denies headache, fevers, malaise, unintentional weight loss, skin rash, eye pain,  sinus congestion and sinus pain, sore throat, dysphagia,  hemoptysis , cough, dyspnea, wheezing, chest pain, palpitations, orthopnea, edema, abdominal pain, nausea, melena, diarrhea, constipation, flank pain, dysuria, hematuria, urinary  Frequency, nocturia, numbness, tingling, seizures,  Focal weakness, Loss of consciousness,  Tremor, insomnia, depression, anxiety, and suicidal ideation.      Objective:  BP 114/68 (BP Location: Left Arm, Patient Position: Sitting, Cuff Size: Large)   Pulse 88   Temp 98.3 F (36.8 C) (Oral)   Resp 15   Ht 5\' 7"  (1.702 m)   Wt 214 lb 6.4 oz (97.3 kg)   LMP 01/29/2014 (Approximate)   SpO2 98%   BMI 33.58 kg/m   BP Readings from Last 3 Encounters:  12/31/16 114/68  12/14/16 130/64  11/25/16 118/60    Wt Readings from Last 3 Encounters:  12/31/16 214 lb 6.4 oz (97.3 kg)  12/14/16 216 lb (98 kg)  11/17/16 214 lb 6.4 oz (97.3 kg)    General appearance: alert, cooperative and appears stated age Ears: normal TM's and external ear canals both ears Throat: lips, mucosa, and tongue normal; teeth and gums normal Neck: no adenopathy, no carotid bruit, supple, symmetrical, trachea midline and thyroid not enlarged, symmetric, no tenderness/mass/nodules Back: symmetric, no curvature. ROM normal. No CVA tenderness. Lungs: clear to auscultation bilaterally Heart: regular rate and rhythm, S1, S2 normal, no murmur, click, rub or gallop Abdomen: soft, non-tender; bowel sounds normal; no masses,  no organomegaly  Pulses: 2+ and symmetric Skin: Skin color, texture, turgor normal. No rashes or lesions Lymph nodes: Cervical, supraclavicular, and axillary nodes normal.  Lab Results  Component Value Date   HGBA1C 7.5 11/17/2016   HGBA1C 7.1 (H) 08/12/2016   HGBA1C 7.4 (H) 05/06/2016    Lab Results  Component Value Date   CREATININE 1.13 11/25/2016   CREATININE 0.95 10/27/2016   CREATININE 1.14 08/12/2016    Lab Results  Component Value Date   WBC 13.6  (H) 10/27/2016   HGB 13.8 10/27/2016   HCT 40.5 10/27/2016   PLT 286 10/27/2016   GLUCOSE 131 (H) 11/25/2016   CHOL 232 (H) 08/12/2016   TRIG 160.0 (H) 08/12/2016   HDL 42.70 08/12/2016   LDLDIRECT 154.0 02/02/2016   LDLCALC 158 (H) 08/12/2016   ALT 18 08/12/2016   AST 16 08/12/2016   NA 136 11/25/2016   K 4.1 11/25/2016   CL 102 11/25/2016   CREATININE 1.13 11/25/2016   BUN 27 (H) 11/25/2016   CO2 28 11/25/2016   TSH 1.87 02/02/2016   HGBA1C 7.5 11/17/2016   MICROALBUR 6.2 (H) 05/06/2016    Mr Brain Wo Contrast  Result Date: 12/07/2016 CLINICAL DATA:  Recurrent vertigo.  Hearing loss. EXAM: MRI HEAD WITHOUT CONTRAST TECHNIQUE: Multiplanar, multiecho pulse sequences of the brain and surrounding structures were obtained without intravenous contrast. COMPARISON:  Head CT 10/27/2016 FINDINGS: Brain: There is no evidence of acute infarct, intracranial hemorrhage, mass, midline shift, or extra-axial fluid collection. The ventricles and sulci are within normal limits for age. The brain is normal in signal. Limited assessment of the seventh and eighth cranial nerve complexes on this nondedicated study is unremarkable. Vascular: Major intracranial vascular flow voids are preserved. Skull and upper cervical spine: Unremarkable bone marrow signal. Sinuses/Orbits: Bilateral cataract extraction. Paranasal sinuses and mastoid air cells are clear. Other: None. IMPRESSION: Negative brain MRI. Electronically Signed   By: Logan Bores M.D.   On: 12/07/2016 09:33    Assessment & Plan:   Problem List Items Addressed This Visit    Syncope    Cardiology workup underway to rule out arrhythmia as cause. Advised to cancel the sleep study , as her husband has observed her sleep and has not observed any apneic breathing       Hypercholesterolemia with hypertriglyceridemia   Relevant Orders   Lipid panel   Essential hypertension    hctz suspended due to relatively low blood pressure.      Edema of  both legs    Advised to use compression stockings.       CKD stage 3 due to type 2 diabetes mellitus (HCC) - Primary   Relevant Orders   Hemoglobin A1c   Comprehensive metabolic panel    Other Visit Diagnoses    B12 deficiency       Relevant Orders   Vitamin B12    A total of 25 minutes of face to face time was spent with patient more than half of which was spent in counselling about the above mentioned conditions  and coordination of care   I am having Ms. Hafley maintain her PROBIOTIC MATURE ADULT, fish oil-omega-3 fatty acids, glucose blood, losartan, fenofibrate, HEALTHY ACCENTS UNIFINE PENTIP, hydrochlorothiazide, ACCU-CHEK SOFTCLIX LANCETS, and HUMULIN R U-500 KWIKPEN.  No orders of the defined types were placed in this encounter.   There are no discontinued medications.  Follow-up: Return for follow up diabetes O [facility] AFTER jUNE 29.   Crecencio Mc, MD

## 2017-01-02 DIAGNOSIS — R600 Localized edema: Secondary | ICD-10-CM | POA: Insufficient documentation

## 2017-01-02 NOTE — Assessment & Plan Note (Signed)
hctz suspended due to relatively low blood pressure.

## 2017-01-02 NOTE — Assessment & Plan Note (Signed)
Advised to use compression stockings.

## 2017-01-02 NOTE — Assessment & Plan Note (Addendum)
Cardiology workup underway to rule out arrhythmia as cause. Advised to cancel the sleep study , as her husband has observed her sleep and has not observed any apneic breathing

## 2017-01-19 ENCOUNTER — Telehealth: Payer: Self-pay | Admitting: Cardiovascular Disease

## 2017-01-19 NOTE — Telephone Encounter (Signed)
Patient called to see when she can return monitor today. She states that this is day 30 and just wanted to make sure there was no specific time that it needs to be removed. Instructed her to remove anytime today and then put all pieces back in the box and drop off at a [employer] drop box. She verbalized understanding of our conversation, monitor return, and had no further questions at this time.

## 2017-01-19 NOTE — Telephone Encounter (Signed)
Patient was told to call office when taking off 30 day [employer] monitor today.  Patient spouse wants to know what the next steps are going to be. Please call to discuss.

## 2017-02-14 ENCOUNTER — Telehealth: Payer: Self-pay | Admitting: Cardiovascular Disease

## 2017-02-14 NOTE — Telephone Encounter (Signed)
Returned call to patient. Gave her the results as follows: "Notes recorded by Minna Merritts, MD on 02/14/2017 at 12:54 PM EDT Event monitor No significant arrhythmia to explain previous episode of syncope"  She verbalized understanding. She then put her husband on the phone. He had a question about billing from his [employer] stating "To consider this sevice for payment, please submit with medical records." Advised patient to check with insurance to make sure this is still needed and to bring the letter by our office if possible. He verbalized understanding.

## 2017-02-14 NOTE — Telephone Encounter (Signed)
Patient returning call to go over monitor results  °

## 2017-02-18 ENCOUNTER — Other Ambulatory Visit (INDEPENDENT_AMBULATORY_CARE_PROVIDER_SITE_OTHER): Payer: BLUE CROSS/BLUE SHIELD

## 2017-02-18 ENCOUNTER — Other Ambulatory Visit: Payer: Self-pay | Admitting: Internal Medicine

## 2017-02-18 DIAGNOSIS — E1122 Type 2 diabetes mellitus with diabetic chronic kidney disease: Secondary | ICD-10-CM | POA: Diagnosis not present

## 2017-02-18 DIAGNOSIS — E782 Mixed hyperlipidemia: Secondary | ICD-10-CM | POA: Diagnosis not present

## 2017-02-18 DIAGNOSIS — E538 Deficiency of other specified B group vitamins: Secondary | ICD-10-CM

## 2017-02-18 DIAGNOSIS — N183 Chronic kidney disease, stage 3 (moderate): Secondary | ICD-10-CM | POA: Diagnosis not present

## 2017-02-18 LAB — LIPID PANEL
CHOLESTEROL: 240 mg/dL — AB (ref 0–200)
HDL: 39.1 mg/dL (ref >39.00)
LDL CALC: 164 mg/dL — AB (ref 0–99)
NONHDL: 200.79
Total CHOL/HDL Ratio: 6
Triglycerides: 183 mg/dL — ABNORMAL HIGH (ref 0.0–149.0)
VLDL: 36.6 mg/dL (ref 0.0–40.0)

## 2017-02-18 LAB — COMPREHENSIVE METABOLIC PANEL
ALK PHOS: 43 U/L (ref 39–117)
ALT: 15 U/L (ref 0–35)
AST: 13 U/L (ref 0–37)
Albumin: 4.1 g/dL (ref 3.5–5.2)
BUN: 22 mg/dL (ref 6–23)
CHLORIDE: 104 meq/L (ref 96–112)
CO2: 26 mEq/L (ref 19–32)
Calcium: 9.5 mg/dL (ref 8.4–10.5)
Creatinine, Ser: 1.06 mg/dL (ref 0.40–1.20)
GFR: 56.34 mL/min — AB (ref >60.00)
GLUCOSE: 178 mg/dL — AB (ref 70–99)
Potassium: 4.3 mEq/L (ref 3.5–5.1)
SODIUM: 138 meq/L (ref 135–145)
Total Bilirubin: 0.4 mg/dL (ref 0.2–1.2)
Total Protein: 7.2 g/dL (ref 6.0–8.3)

## 2017-02-18 LAB — HEMOGLOBIN A1C: Hgb A1c MFr Bld: 8 % — ABNORMAL HIGH (ref 4.6–6.5)

## 2017-02-18 LAB — VITAMIN B12

## 2017-02-25 ENCOUNTER — Encounter: Payer: Self-pay | Admitting: Internal Medicine

## 2017-02-25 ENCOUNTER — Ambulatory Visit (INDEPENDENT_AMBULATORY_CARE_PROVIDER_SITE_OTHER): Payer: BLUE CROSS/BLUE SHIELD | Admitting: Internal Medicine

## 2017-02-25 DIAGNOSIS — E1165 Type 2 diabetes mellitus with hyperglycemia: Secondary | ICD-10-CM | POA: Diagnosis not present

## 2017-02-25 DIAGNOSIS — Z794 Long term (current) use of insulin: Secondary | ICD-10-CM

## 2017-02-25 DIAGNOSIS — E782 Mixed hyperlipidemia: Secondary | ICD-10-CM

## 2017-02-25 DIAGNOSIS — R55 Syncope and collapse: Secondary | ICD-10-CM | POA: Diagnosis not present

## 2017-02-25 DIAGNOSIS — R400 Somnolence: Secondary | ICD-10-CM | POA: Diagnosis not present

## 2017-02-25 DIAGNOSIS — I10 Essential (primary) hypertension: Secondary | ICD-10-CM

## 2017-02-25 DIAGNOSIS — IMO0002 Reserved for concepts with insufficient information to code with codable children: Secondary | ICD-10-CM

## 2017-02-25 DIAGNOSIS — E1121 Type 2 diabetes mellitus with diabetic nephropathy: Secondary | ICD-10-CM

## 2017-02-25 NOTE — Patient Instructions (Addendum)
To improve your diabetes control:  Target one meal per week:  (breakfast, lunch or dinner)    Check your sugar right before,  And 2 hours after that meal  ("Post prandial")  If your 2 hr post prandial(2 hrs after a meal)   is > 160 on a Consistent basis , increase the pre meal insulin by 1 click .  You will start to notice which meals cause your BS to go up  Send me the readings weekly and I will help you if you are not sure what to do  I would like to see you again in one month

## 2017-02-25 NOTE — Progress Notes (Signed)
Subjective:  Patient ID: Pamela Harris, female    DOB: 05-05-1956  Age: 61 y.o. MRN: [id number]  CC: Diagnoses of Uncontrolled type 2 diabetes mellitus with diabetic nephropathy, with long-term current use of insulin ([place]), Daytime somnolence, Essential hypertension, Syncope, unspecified syncope type, and Hypercholesterolemia with hypertriglyceridemia were pertinent to this visit.  HPI [name] presents for follow up on type 2 DM, obesty and elevated triglcyerides. She is accompanied by her husband.  Recent labs were reviewed in detail with patient and husband and  Her loss of diabetes control was discussed.  She reports compliance with medication regimen about 85% of the time.  She is not exercising. Her diet is some   Diet discussed in detail.  Still  eating oatmeal,  Cereal.otherwise low glycemic index mostly.  Using R-500 insulin  In a pen. Pre meal.  Follow up on on syncopal episode in May.  Cardiac monitor failed to demonstrate any source or arrhythmias, just rare PVCs.  No subsequent events have occurred    Lab Results  Component Value Date   HGBA1C 8.0 (H) 02/18/2017   Lab Results  Component Value Date   CHOL 240 (H) 02/18/2017   HDL 39.10 02/18/2017   LDLCALC 164 (H) 02/18/2017   LDLDIRECT 154.0 02/02/2016   TRIG 183.0 (H) 02/18/2017   CHOLHDL 6 02/18/2017     Outpatient Medications Prior to Visit  Medication Sig Dispense Refill  . ACCU-CHEK COMPACT PLUS test strip USE 3 TIMES A DAY 306 each 1  . ACCU-CHEK SOFTCLIX LANCETS lancets Use as instructed 100 each 12  . fenofibrate 160 MG tablet TAKE ONE TABLET BY MOUTH DAILY  90 tablet 2  . fish oil-omega-3 fatty acids 1000 MG capsule Take 1 g by mouth 2 (two) times daily.    [name] HEALTHY ACCENTS UNIFINE PENTIP 31G X 8 MM MISC USE AS DIRECTED, ONE THREE TIMES DAILY  100 each 11  . HUMULIN R U-500 KWIKPEN 500 UNIT/ML kwikpen INJECT 75 UNITS AT BREAKFAST, 65 UNITS AT LUNCH AND 75 UNITS WITH SUPPER (MAX DAILY DOSE 215  UNITS) 18 mL 2  . hydrochlorothiazide (HYDRODIURIL) 25 MG tablet Take 1 tablet (25 mg total) by mouth daily. 30 tablet 3  . losartan (COZAAR) 100 MG tablet TAKE ONE TABLET BY MOUTH DAILY  90 tablet 2  . Probiotic Product (PROBIOTIC MATURE ADULT) CAPS Take 1 capsule by mouth daily.     No facility-administered medications prior to visit.     Review of Systems;  Patient denies headache, fevers, malaise, unintentional weight loss, skin rash, eye pain, sinus congestion and sinus pain, sore throat, dysphagia,  hemoptysis , cough, dyspnea, wheezing, chest pain, palpitations, orthopnea, edema, abdominal pain, nausea, melena, diarrhea, constipation, flank pain, dysuria, hematuria, urinary  Frequency, nocturia, numbness, tingling, seizures,  Focal weakness, Loss of consciousness,  Tremor, insomnia, depression, anxiety, and suicidal ideation.      Objective:  BP 124/76 (BP Location: Left Arm, Patient Position: Sitting, Cuff Size: Large)   Pulse 86   Temp 98.2 F (36.8 C) (Oral)   Resp 15   Ht 5\' 7"  (1.702 m)   Wt 218 lb 6.4 oz (99.1 kg)   LMP 01/29/2014 (Approximate)   SpO2 (!) 86%   BMI 34.21 kg/m   BP Readings from Last 3 Encounters:  02/25/17 124/76  12/31/16 114/68  12/14/16 130/64    Wt Readings from Last 3 Encounters:  02/25/17 218 lb 6.4 oz (99.1 kg)  12/31/16 214 lb 6.4 oz (97.3 kg)  12/14/16 216 lb (98 kg)    General appearance: alert, cooperative and appears stated age Ears: normal TM's and external ear canals both ears Throat: lips, mucosa, and tongue normal; teeth and gums normal Neck: no adenopathy, no carotid bruit, supple, symmetrical, trachea midline and thyroid not enlarged, symmetric, no tenderness/mass/nodules Back: symmetric, no curvature. ROM normal. No CVA tenderness. Lungs: clear to auscultation bilaterally Heart: regular rate and rhythm, S1, S2 normal, no murmur, click, rub or gallop Abdomen: soft, non-tender; bowel sounds normal; no masses,  no  organomegaly Pulses: 2+ and symmetric Skin: Skin color, texture, turgor normal. No rashes or lesions Lymph nodes: Cervical, supraclavicular, and axillary nodes normal.  Lab Results  Component Value Date   HGBA1C 8.0 (H) 02/18/2017   HGBA1C 7.5 11/17/2016   HGBA1C 7.1 (H) 08/12/2016    Lab Results  Component Value Date   CREATININE 1.06 02/18/2017   CREATININE 1.13 11/25/2016   CREATININE 0.95 10/27/2016    Lab Results  Component Value Date   WBC 13.6 (H) 10/27/2016   HGB 13.8 10/27/2016   HCT 40.5 10/27/2016   PLT 286 10/27/2016   GLUCOSE 178 (H) 02/18/2017   CHOL 240 (H) 02/18/2017   TRIG 183.0 (H) 02/18/2017   HDL 39.10 02/18/2017   LDLDIRECT 154.0 02/02/2016   LDLCALC 164 (H) 02/18/2017   ALT 15 02/18/2017   AST 13 02/18/2017   NA 138 02/18/2017   K 4.3 02/18/2017   CL 104 02/18/2017   CREATININE 1.06 02/18/2017   BUN 22 02/18/2017   CO2 26 02/18/2017   TSH 1.87 02/02/2016   HGBA1C 8.0 (H) 02/18/2017   MICROALBUR 6.2 (H) 05/06/2016    Mr Brain Wo Contrast  Result Date: 12/07/2016 CLINICAL DATA:  Recurrent vertigo.  Hearing loss. EXAM: MRI HEAD WITHOUT CONTRAST TECHNIQUE: Multiplanar, multiecho pulse sequences of the brain and surrounding structures were obtained without intravenous contrast. COMPARISON:  Head CT 10/27/2016 FINDINGS: Brain: There is no evidence of acute infarct, intracranial hemorrhage, mass, midline shift, or extra-axial fluid collection. The ventricles and sulci are within normal limits for age. The brain is normal in signal. Limited assessment of the seventh and eighth cranial nerve complexes on this nondedicated study is unremarkable. Vascular: Major intracranial vascular flow voids are preserved. Skull and upper cervical spine: Unremarkable bone marrow signal. Sinuses/Orbits: Bilateral cataract extraction. Paranasal sinuses and mastoid air cells are clear. Other: None. IMPRESSION: Negative brain MRI. Electronically Signed   By: Logan Bores M.D.    On: 12/07/2016 09:33    Assessment & Plan:   Problem List Items Addressed This Visit    DM (diabetes mellitus), type 2, uncontrolled, with renal complications ([place])    Advised to target each mealtime for a week to determine where the loss of control is occurring,  Starting with breakfast.  Asked to submit readings to me weekly.  Follow up one month       Relevant Medications   aspirin EC 81 MG tablet   Hypercholesterolemia with hypertriglyceridemia    Managed with fenofibrate only due to intolerance to zetia as well as statins. Her 10 year risk is 16%  .   Aspirin advised.  Lab Results  Component Value Date   CHOL 240 (H) 02/18/2017   HDL 39.10 02/18/2017   LDLCALC 164 (H) 02/18/2017   LDLDIRECT 154.0 02/02/2016   TRIG 183.0 (H) 02/18/2017   CHOLHDL 6 02/18/2017          Relevant Medications   aspirin EC 81 MG tablet  Essential hypertension    Well controlled on current regimen. Renal function stable, no changes today.  Lab Results  Component Value Date   NA 138 02/18/2017   K 4.3 02/18/2017   CL 104 02/18/2017   CO2 26 02/18/2017   Lab Results  Component Value Date   NA 138 02/18/2017   K 4.3 02/18/2017   CL 104 02/18/2017   CO2 26 02/18/2017         Relevant Medications   aspirin EC 81 MG tablet   Daytime somnolence    Sleep study was offered but denied by insurance,  Husband has not noticed any apneic evens.  Likely due to sedentary nature.       Syncope    Cardiology workup for  arrhythmia was unrevealing. No recurrent episodes.  MRI normal.  No further workup unclear there is a recurrence.       Relevant Medications   aspirin EC 81 MG tablet    A total of 25 minutes of face to face time was spent with patient more than half of which was spent in counselling about the above mentioned conditions  and coordination of care   I am having Ms. Millirons start on aspirin EC. I am also having her maintain her PROBIOTIC MATURE ADULT, fish oil-omega-3 fatty  acids, losartan, fenofibrate, HEALTHY ACCENTS UNIFINE PENTIP, hydrochlorothiazide, ACCU-CHEK SOFTCLIX LANCETS, HUMULIN R U-500 KWIKPEN, and ACCU-CHEK COMPACT PLUS.  Meds ordered this encounter  Medications  . aspirin EC 81 MG tablet    Sig: Take 1 tablet (81 mg total) by mouth daily.    Dispense:  90 tablet    Refill:  3    There are no discontinued medications.  Follow-up: Return in about 4 weeks (around 03/25/2017).   Crecencio Mc, MD

## 2017-02-27 MED ORDER — ASPIRIN EC 81 MG PO TBEC: 81.0000 mg | DELAYED_RELEASE_TABLET | Freq: Every day | ORAL | 3 refills | Status: AC

## 2017-02-27 NOTE — Assessment & Plan Note (Signed)
Well controlled on current regimen. Renal function stable, no changes today.  Lab Results  Component Value Date   NA 138 02/18/2017   K 4.3 02/18/2017   CL 104 02/18/2017   CO2 26 02/18/2017   Lab Results  Component Value Date   NA 138 02/18/2017   K 4.3 02/18/2017   CL 104 02/18/2017   CO2 26 02/18/2017

## 2017-02-27 NOTE — Assessment & Plan Note (Signed)
Cardiology workup for  arrhythmia was unrevealing. No recurrent episodes.  MRI normal.  No further workup unclear there is a recurrence.

## 2017-02-27 NOTE — Assessment & Plan Note (Signed)
Sleep study was offered but denied by insurance,  Husband has not noticed any apneic evens.  Likely due to sedentary nature.

## 2017-02-27 NOTE — Assessment & Plan Note (Signed)
Advised to target each mealtime for a week to determine where the loss of control is occurring,  Starting with breakfast.  Asked to submit readings to me weekly.  Follow up one month

## 2017-02-27 NOTE — Assessment & Plan Note (Signed)
Managed with fenofibrate only due to intolerance to zetia as well as statins. Her 10 year risk is 16%  .   Aspirin advised.  Lab Results  Component Value Date   CHOL 240 (H) 02/18/2017   HDL 39.10 02/18/2017   LDLCALC 164 (H) 02/18/2017   LDLDIRECT 154.0 02/02/2016   TRIG 183.0 (H) 02/18/2017   CHOLHDL 6 02/18/2017

## 2017-04-04 ENCOUNTER — Encounter: Payer: Self-pay | Admitting: Internal Medicine

## 2017-04-04 ENCOUNTER — Ambulatory Visit (INDEPENDENT_AMBULATORY_CARE_PROVIDER_SITE_OTHER): Payer: BLUE CROSS/BLUE SHIELD | Admitting: Internal Medicine

## 2017-04-04 VITALS — BP 138/78 | HR 15 | Temp 98.5°F | Resp 15 | Ht 67.0 in | Wt 216.2 lb

## 2017-04-04 DIAGNOSIS — E1121 Type 2 diabetes mellitus with diabetic nephropathy: Secondary | ICD-10-CM

## 2017-04-04 DIAGNOSIS — Z794 Long term (current) use of insulin: Secondary | ICD-10-CM | POA: Diagnosis not present

## 2017-04-04 DIAGNOSIS — E1165 Type 2 diabetes mellitus with hyperglycemia: Secondary | ICD-10-CM

## 2017-04-04 DIAGNOSIS — Z79899 Other long term (current) drug therapy: Secondary | ICD-10-CM | POA: Diagnosis not present

## 2017-04-04 DIAGNOSIS — IMO0002 Reserved for concepts with insufficient information to code with codable children: Secondary | ICD-10-CM

## 2017-04-04 MED ORDER — PEN NEEDLES 31G X 6 MM MISC: 1 refills | Status: AC

## 2017-04-04 MED ORDER — LIRAGLUTIDE 18 MG/3ML ~~LOC~~ SOPN: PEN_INJECTOR | SUBCUTANEOUS | 2 refills | Status: AC

## 2017-04-04 NOTE — Progress Notes (Signed)
Subjective:  Patient ID: Pamela Harris, female    DOB: 1956/05/02  Age: 61 y.o. MRN: [id number]  CC: The primary encounter diagnosis was Long-term use of high-risk medication. A diagnosis of Uncontrolled type 2 diabetes mellitus with diabetic nephropathy, with long-term current use of insulin ([facility]) was also pertinent to this visit.  HPI Pamela Harris presents for 4 week follow up on  Uncontrolled type 2 dm.  Patient was asked to target specific  meals with pre and post prandial readings.  Log of blood sugars is reviewed today with patient. Fasting sugars have been < 150 75% of the itme,  Post prandials have been > 200 15% of the time .  She is eating whole wheat pancakes almost daily and is hostile to any attempts to dissuade her from having som many carbohydrates at one meal because "I am eating exactly what the ADA says I can eat and I won't eat anything that doesn't taste good."     Lab Results  Component Value Date   HGBA1C 8.0 (H) 02/18/2017   She reports that she has increased appetite, does not skip meals  Eats low carb snacks. Has begun a walking program ,  Has worked her way up to 20 minutes daily at a speed of 1.7 miles per hour on a treadmill   has increased her evening R500 insulin dose by one click as previously directed and had two low blood sugars after that,  Compensated by overeating which raised her blood sugars the following day   Outpatient Medications Prior to Visit  Medication Sig Dispense Refill  . ACCU-CHEK COMPACT PLUS test strip USE 3 TIMES A DAY 306 each 1  . ACCU-CHEK SOFTCLIX LANCETS lancets Use as instructed 100 each 12  . aspirin EC 81 MG tablet Take 1 tablet (81 mg total) by mouth daily. 90 tablet 3  . fenofibrate 160 MG tablet TAKE ONE TABLET BY MOUTH DAILY  90 tablet 2  . fish oil-omega-3 fatty acids 1000 MG capsule Take 1 g by mouth 2 (two) times daily.    [name] HEALTHY ACCENTS UNIFINE PENTIP 31G X 8 MM MISC USE AS DIRECTED, ONE THREE TIMES DAILY  100  each 11  . HUMULIN R U-500 KWIKPEN 500 UNIT/ML kwikpen INJECT 75 UNITS AT BREAKFAST, 65 UNITS AT LUNCH AND 75 UNITS WITH SUPPER (MAX DAILY DOSE 215 UNITS) 18 mL 2  . hydrochlorothiazide (HYDRODIURIL) 25 MG tablet Take 1 tablet (25 mg total) by mouth daily. 30 tablet 3  . losartan (COZAAR) 100 MG tablet TAKE ONE TABLET BY MOUTH DAILY  90 tablet 2  . Probiotic Product (PROBIOTIC MATURE ADULT) CAPS Take 1 capsule by mouth daily.     No facility-administered medications prior to visit.     Review of Systems;  Patient denies headache, fevers, malaise, unintentional weight loss, skin rash, eye pain, sinus congestion and sinus pain, sore throat, dysphagia,  hemoptysis , cough, dyspnea, wheezing, chest pain, palpitations, orthopnea, edema, abdominal pain, nausea, melena, diarrhea, constipation, flank pain, dysuria, hematuria, urinary  Frequency, nocturia, numbness, tingling, seizures,  Focal weakness, Loss of consciousness,  Tremor, insomnia, depression, anxiety, and suicidal ideation.      Objective:  BP 138/78 (BP Location: Left Arm, Patient Position: Sitting, Cuff Size: Large)   Pulse (!) 15   Temp 98.5 F (36.9 C) (Oral)   Resp 15   Ht 5\' 7"  (1.702 m)   Wt 216 lb 3.2 oz (98.1 kg)   LMP 01/29/2014 (Approximate)  SpO2 98%   BMI 33.86 kg/m   BP Readings from Last 3 Encounters:  04/04/17 138/78  02/25/17 124/76  12/31/16 114/68    Wt Readings from Last 3 Encounters:  04/04/17 216 lb 3.2 oz (98.1 kg)  02/25/17 218 lb 6.4 oz (99.1 kg)  12/31/16 214 lb 6.4 oz (97.3 kg)    General appearance: alert, cooperative and appears stated age No physical exam was done today.   Lab Results  Component Value Date   HGBA1C 8.0 (H) 02/18/2017   HGBA1C 7.5 11/17/2016   HGBA1C 7.1 (H) 08/12/2016    Lab Results  Component Value Date   CREATININE 1.06 02/18/2017   CREATININE 1.13 11/25/2016   CREATININE 0.95 10/27/2016    Lab Results  Component Value Date   WBC 13.6 (H) 10/27/2016    HGB 13.8 10/27/2016   HCT 40.5 10/27/2016   PLT 286 10/27/2016   GLUCOSE 178 (H) 02/18/2017   CHOL 240 (H) 02/18/2017   TRIG 183.0 (H) 02/18/2017   HDL 39.10 02/18/2017   LDLDIRECT 154.0 02/02/2016   LDLCALC 164 (H) 02/18/2017   ALT 15 02/18/2017   AST 13 02/18/2017   NA 138 02/18/2017   K 4.3 02/18/2017   CL 104 02/18/2017   CREATININE 1.06 02/18/2017   BUN 22 02/18/2017   CO2 26 02/18/2017   TSH 1.87 02/02/2016   HGBA1C 8.0 (H) 02/18/2017   MICROALBUR 6.2 (H) 05/06/2016     Assessment & Plan:   Problem List Items Addressed This Visit    DM (diabetes mellitus), type 2, uncontrolled, with renal complications ([place])    Blood sugars remain labile due to lack of sufficient exercise and continued dietary indulgences in pancakes.  Given her increased appetite and BMI,  Adding a trial of Victoza. With directions to advance dose to 1.2 mg after one week, and incaresae to 1.8 mg after two weeks if her blood sugars continue to stay > 160 post prandially.   RTC 3 weeks for CMET and lipase. Referral to endocrinology for management       Relevant Medications   liraglutide 18 MG/3ML SOPN   Other Relevant Orders   Ambulatory referral to Endocrinology    Other Visit Diagnoses    Long-term use of high-risk medication    -  Primary   Relevant Orders   Comprehensive metabolic panel   Lipase    A total of 25 minutes of face to face time was spent with patient more than half of which was spent in counselling about  Her need for dietary and lifestyle modifications   and coordination of care   I am having Ms. Muratore start on liraglutide and Pen Needles. I am also having her maintain her PROBIOTIC MATURE ADULT, fish oil-omega-3 fatty acids, losartan, fenofibrate, HEALTHY ACCENTS UNIFINE PENTIP, hydrochlorothiazide, ACCU-CHEK SOFTCLIX LANCETS, HUMULIN R U-500 KWIKPEN, ACCU-CHEK COMPACT PLUS, and aspirin EC.  Meds ordered this encounter  Medications  . liraglutide 18 MG/3ML SOPN    Sig: 0.6  mg  SubQ  once daily for 1 week;  increase to 1.2 mg once daily;  may increase  to 1.8 mg once daily    Dispense:  9 mL    Refill:  2  . Insulin Pen Needle (PEN NEEDLES) 31G X 6 MM MISC    Sig: For use with victoza /saxenda    Dispense:  30 each    Refill:  1    There are no discontinued medications.  Follow-up: No Follow-up on file.  Crecencio Mc, MD

## 2017-04-04 NOTE — Patient Instructions (Signed)
I am recommending a trial of Victoza to help curb your appetite and manage your blood sugars.  Continue taking the mealtime insulin   Start with 0.6 mg daily for the first week,  And increase to 1.2 mg daily after one week if you are not having persistent nausea.   Please work you way up to  walking 30 minutes  Every day, and  Work your way up to 3.0 miles per hour as your speed.   Walking after you eat will help lower your post prandial blood sugars   Return after 3 weeks for non fasting labs

## 2017-04-05 ENCOUNTER — Other Ambulatory Visit: Payer: Self-pay | Admitting: Internal Medicine

## 2017-04-05 ENCOUNTER — Telehealth: Payer: Self-pay | Admitting: Internal Medicine

## 2017-04-05 DIAGNOSIS — IMO0002 Reserved for concepts with insufficient information to code with codable children: Secondary | ICD-10-CM

## 2017-04-05 DIAGNOSIS — Z794 Long term (current) use of insulin: Principal | ICD-10-CM

## 2017-04-05 DIAGNOSIS — E1165 Type 2 diabetes mellitus with hyperglycemia: Principal | ICD-10-CM

## 2017-04-05 DIAGNOSIS — Z1231 Encounter for screening mammogram for malignant neoplasm of breast: Secondary | ICD-10-CM

## 2017-04-05 DIAGNOSIS — E1121 Type 2 diabetes mellitus with diabetic nephropathy: Secondary | ICD-10-CM

## 2017-04-05 NOTE — Assessment & Plan Note (Signed)
Blood sugars remain labile due to lack of sufficient exercise and continued dietary indulgences in pancakes.  Given her increased appetite and BMI,  Adding a trial of Victoza. With directions to advance dose to 1.2 mg after one week, and incaresae to 1.8 mg after two weeks if her blood sugars continue to stay > 160 post prandially.   RTC 3 weeks for CMET and lipase. Referral to endocrinology for management

## 2017-04-05 NOTE — Telephone Encounter (Signed)
Please let Pamela Harris know that I am no longer comfortable managing her diabetes based on her attitude yesterday and her resistance to my suggestions,  So I am referring her back to an endocrinologist for management.  The referral is in process,  If she wants to hold off on starting the victoza until she is seen , she can but if she decides to start it I still want her to return for labs in 3 weeks as discussed .  If you are not comfortable relaying this in a phone conversation, we can put it iin a letter  I am not firing her

## 2017-04-06 NOTE — Telephone Encounter (Signed)
Pamela Harris spoke with pt and informed her of Dr. Lupita Dawn message below. The pt gave a verbal understanding and stated that she would like to be referred to a diabetic center in [place] or an endocrinologist in [place]. The pt also stated that she has already started the victoza and has a lab appt scheduled in 3 weeks.

## 2017-04-07 NOTE — Telephone Encounter (Signed)
Pamela Harris,  Sorry for the confusion. Patient wants to see an endo in [place] so I need to cancel the referral to dr solum. Not sure if [facility] has an endo in [place] so I made the referral external

## 2017-04-19 ENCOUNTER — Other Ambulatory Visit: Payer: Self-pay | Admitting: Internal Medicine

## 2017-04-19 NOTE — Telephone Encounter (Signed)
Since the pt has been referred to endocrinology do you want to still fill this?

## 2017-04-20 NOTE — Telephone Encounter (Signed)
YES, UNTIL SHE IS SEEN BY ENDOCRINE

## 2017-04-26 ENCOUNTER — Other Ambulatory Visit (INDEPENDENT_AMBULATORY_CARE_PROVIDER_SITE_OTHER): Payer: BLUE CROSS/BLUE SHIELD

## 2017-04-26 DIAGNOSIS — Z79899 Other long term (current) drug therapy: Secondary | ICD-10-CM | POA: Diagnosis not present

## 2017-04-26 LAB — LIPASE: LIPASE: 24 U/L (ref 11.0–59.0)

## 2017-04-26 LAB — COMPREHENSIVE METABOLIC PANEL
ALBUMIN: 4 g/dL (ref 3.5–5.2)
ALK PHOS: 35 U/L — AB (ref 39–117)
ALT: 15 U/L (ref 0–35)
AST: 16 U/L (ref 0–37)
BILIRUBIN TOTAL: 0.4 mg/dL (ref 0.2–1.2)
BUN: 19 mg/dL (ref 6–23)
CHLORIDE: 99 meq/L (ref 96–112)
CO2: 27 mEq/L (ref 19–32)
CREATININE: 1.08 mg/dL (ref 0.40–1.20)
Calcium: 9.6 mg/dL (ref 8.4–10.5)
GFR: 55.11 mL/min — ABNORMAL LOW (ref >60.00)
Glucose, Bld: 173 mg/dL — ABNORMAL HIGH (ref 70–99)
Potassium: 4 mEq/L (ref 3.5–5.1)
SODIUM: 133 meq/L — AB (ref 135–145)
Total Protein: 6.7 g/dL (ref 6.0–8.3)

## 2017-04-27 ENCOUNTER — Ambulatory Visit
Admission: RE | Admit: 2017-04-27 | Discharge: 2017-04-27 | Disposition: A | Discharge status: Home / Self Care | Payer: BLUE CROSS/BLUE SHIELD | Source: Ambulatory Visit | Attending: Internal Medicine | Admitting: Internal Medicine

## 2017-04-27 DIAGNOSIS — Z1231 Encounter for screening mammogram for malignant neoplasm of breast: Secondary | ICD-10-CM | POA: Insufficient documentation

## 2017-05-02 IMAGING — US US BREAST*R* LIMITED INC AXILLA
1 series · 9 of 9 positions shown · non-contrast
Comparison: Previous exam(s).

CLINICAL DATA: 60 year-old female for evaluation of possible right
breast mass on screening mammogram.

EXAM:
2D DIGITAL DIAGNOSTIC RIGHT MAMMOGRAM WITH ADJUNCT TOMO
ULTRASOUND RIGHT BREAST

[Series 1: us breast*right* limited inc axilla · 0.07mm/px · 9 of 9 slices shown]
[im 1/9]
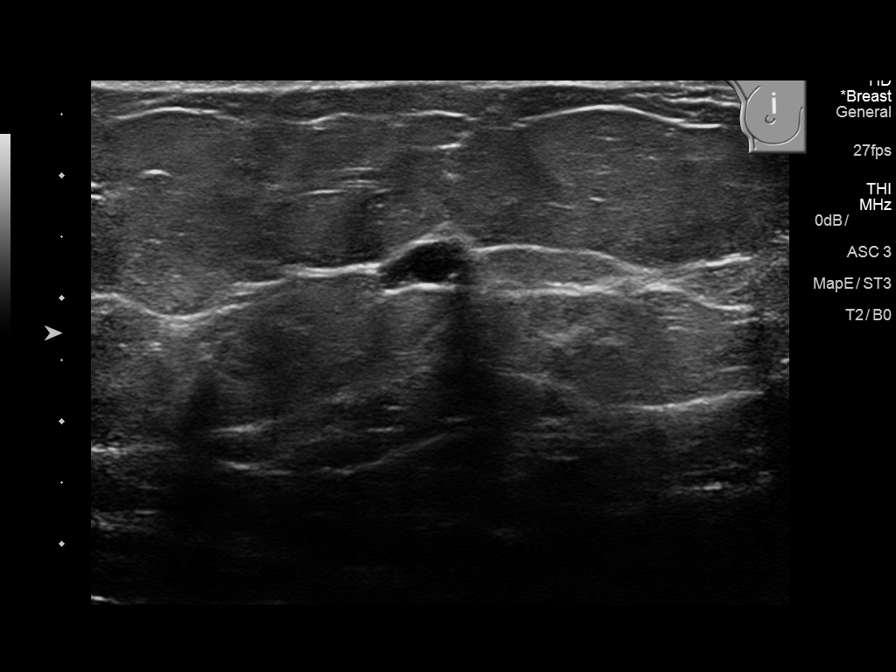
[im 2/9]
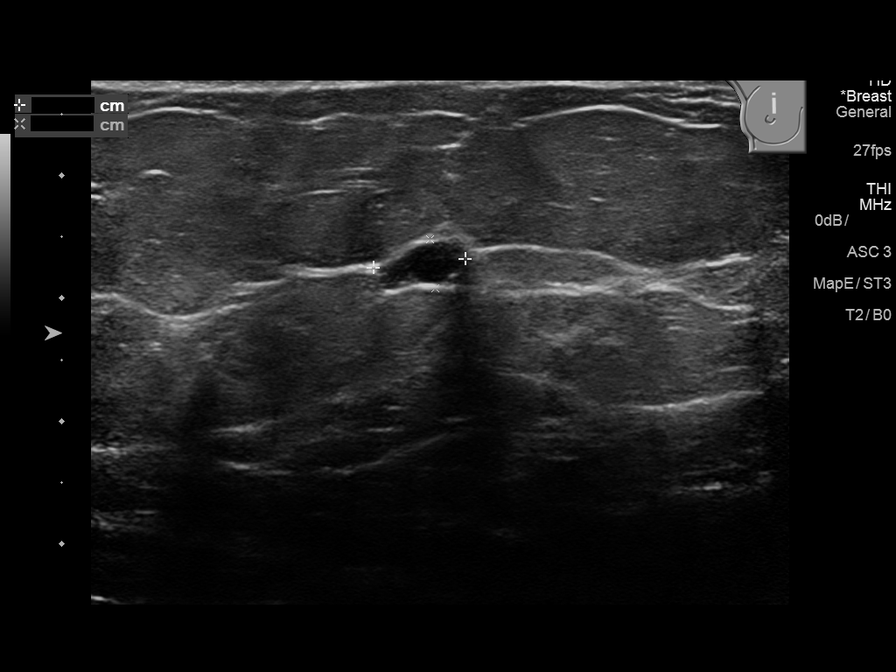
[im 3/9]
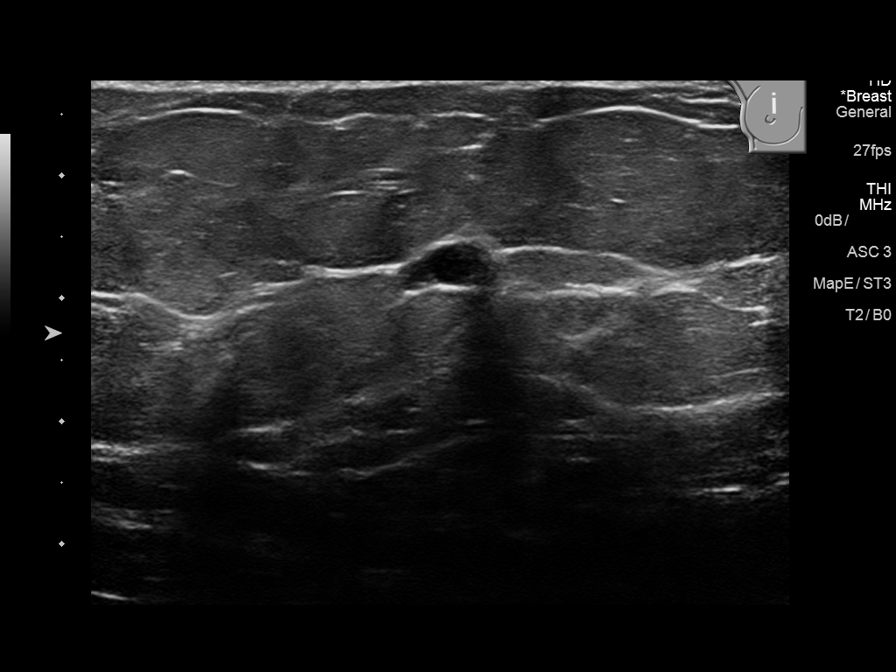
[im 4/9]
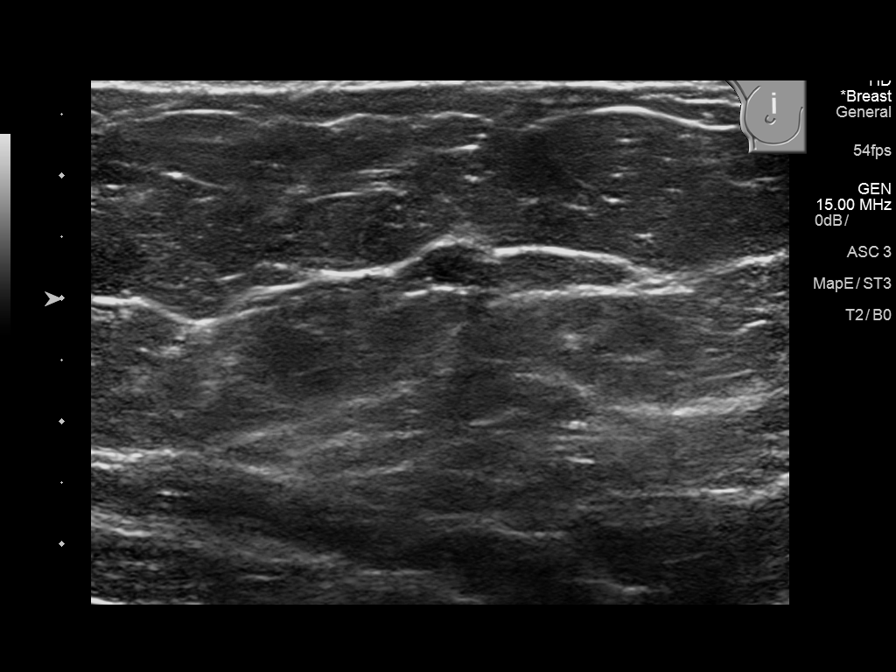
[im 5/9]
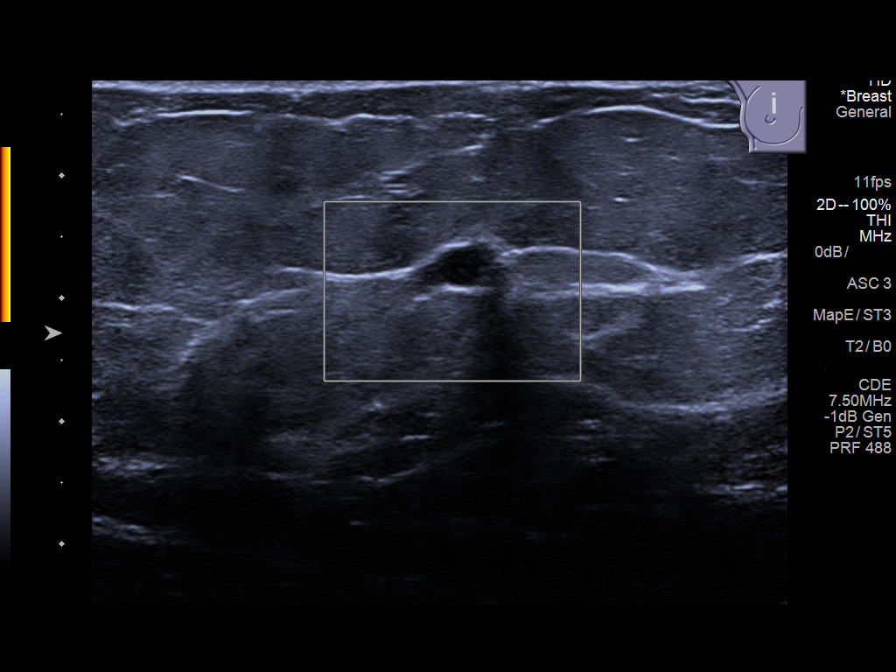
[im 6/9]
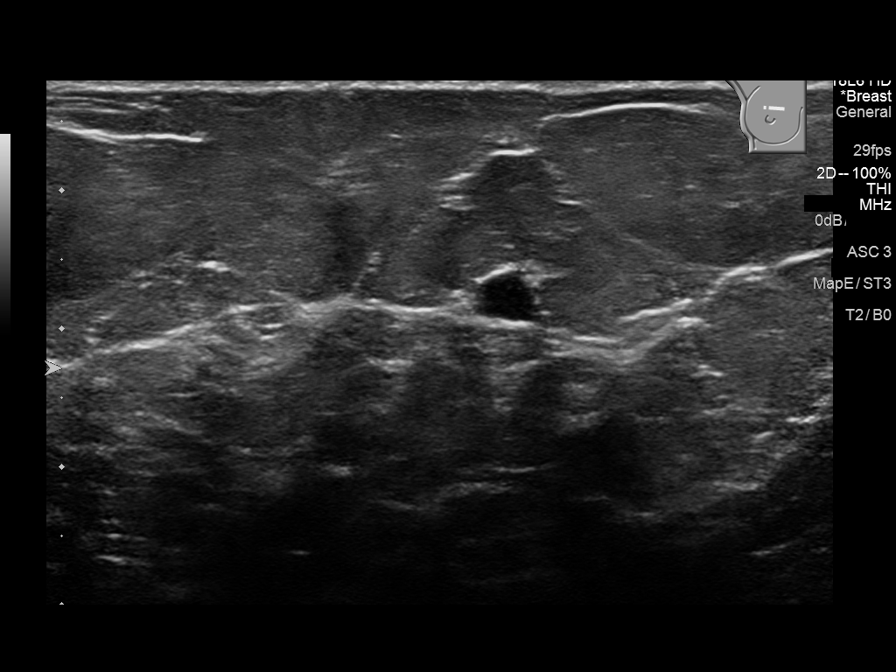
[im 7/9]
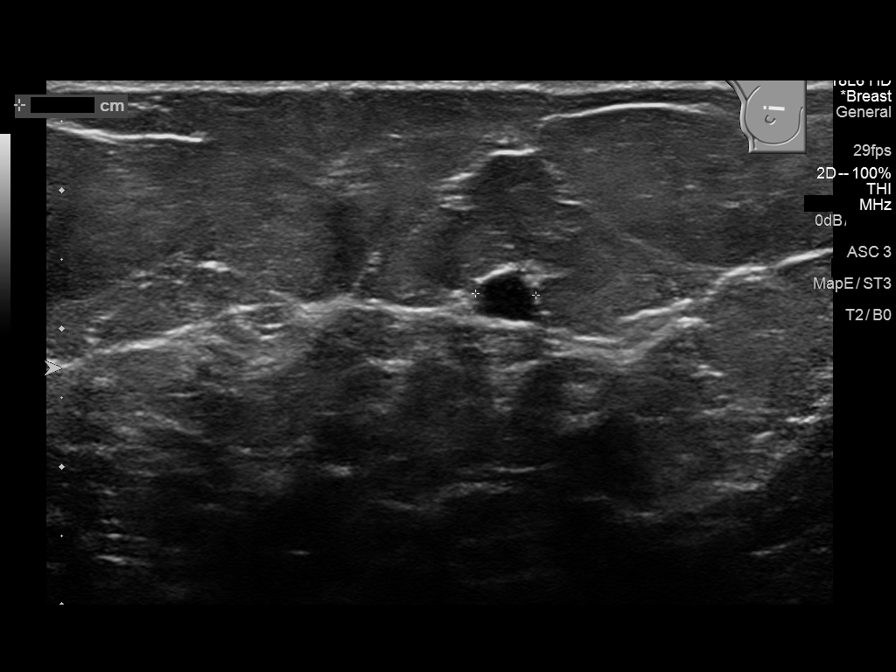
[im 8/9]
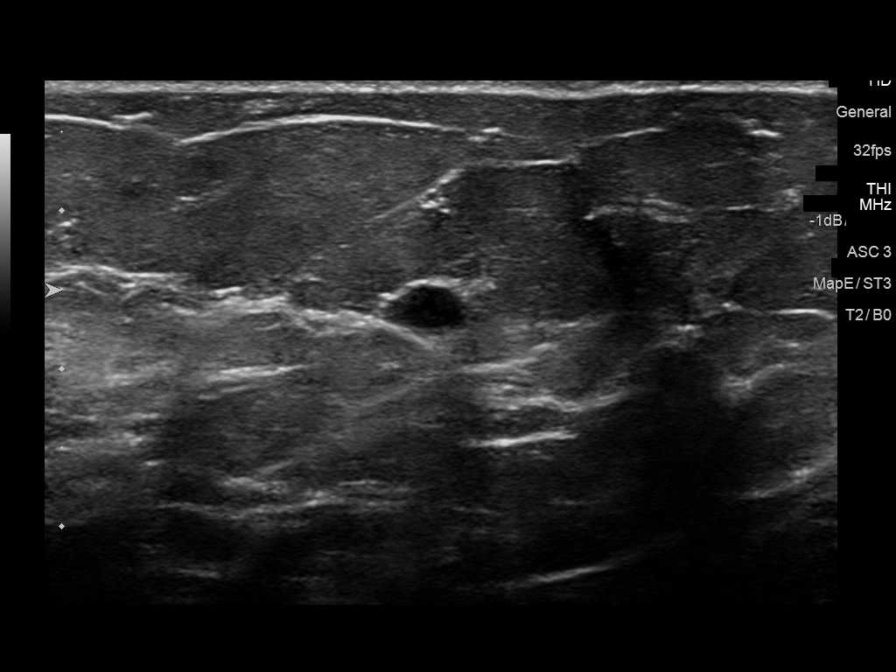
[im 9/9]
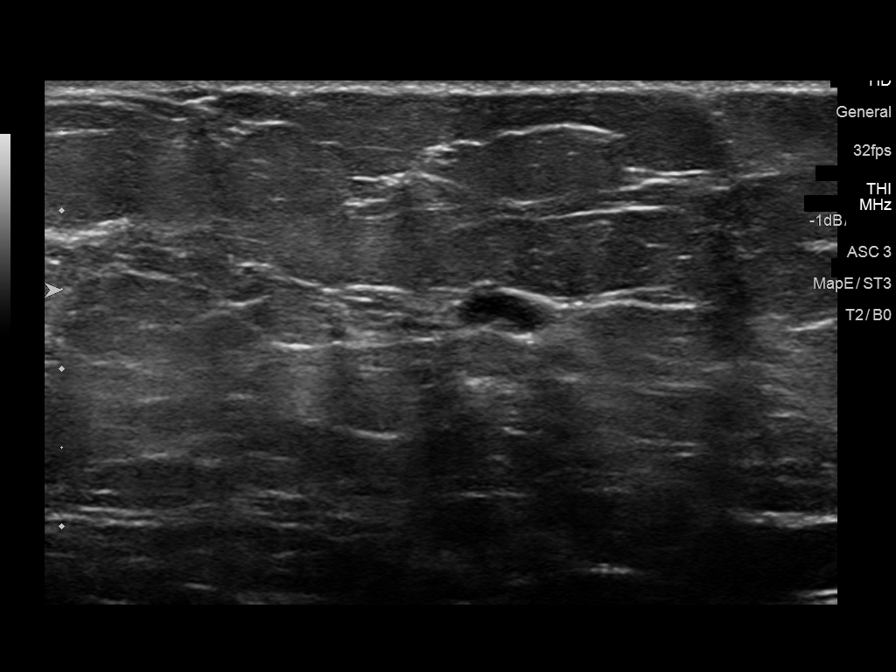

[9 of 9 positions shown; findings below may reference images not displayed]

ACR Breast Density Category b: There are scattered areas of
fibroglandular density.
FINDINGS: 2D and 3D spot compression views of the right breast demonstrate a
circumscribed oval nodule within the anterior upper right breast.

On physical exam, no palpable abnormalities are identified.

Targeted ultrasound is performed, showing a 7 x 4 x 4 mm benign cyst
at the 12 o'clock position of the right breast 3 cm from the nipple,
corresponding to the screening study finding.
IMPRESSION: Benign cyst in the upper right breast corresponding to the screening
study finding.

RECOMMENDATION:
Bilateral screening mammograms in 1 year.

I have discussed the findings and recommendations with the patient.
Results were also provided in writing at the conclusion of the
visit. If applicable, a reminder letter will be sent to the patient
regarding the next appointment.

BI-RADS CATEGORY  2: Benign.

## 2017-06-10 DIAGNOSIS — E119 Type 2 diabetes mellitus without complications: Secondary | ICD-10-CM | POA: Diagnosis not present

## 2017-06-10 DIAGNOSIS — Z794 Long term (current) use of insulin: Secondary | ICD-10-CM | POA: Diagnosis not present

## 2017-06-20 DIAGNOSIS — E119 Type 2 diabetes mellitus without complications: Secondary | ICD-10-CM | POA: Diagnosis not present

## 2017-06-20 DIAGNOSIS — Z794 Long term (current) use of insulin: Secondary | ICD-10-CM | POA: Diagnosis not present

## 2017-06-23 ENCOUNTER — Other Ambulatory Visit: Payer: Self-pay | Admitting: Internal Medicine

## 2017-06-29 ENCOUNTER — Other Ambulatory Visit: Payer: Self-pay | Admitting: Internal Medicine

## 2017-07-27 DIAGNOSIS — Z85828 Personal history of other malignant neoplasm of skin: Secondary | ICD-10-CM | POA: Diagnosis not present

## 2017-07-27 DIAGNOSIS — D485 Neoplasm of uncertain behavior of skin: Secondary | ICD-10-CM | POA: Diagnosis not present

## 2017-07-27 DIAGNOSIS — Z1283 Encounter for screening for malignant neoplasm of skin: Secondary | ICD-10-CM | POA: Diagnosis not present

## 2017-07-27 DIAGNOSIS — L308 Other specified dermatitis: Secondary | ICD-10-CM | POA: Diagnosis not present

## 2017-07-27 DIAGNOSIS — L820 Inflamed seborrheic keratosis: Secondary | ICD-10-CM | POA: Diagnosis not present

## 2017-08-03 DIAGNOSIS — I129 Hypertensive chronic kidney disease with stage 1 through stage 4 chronic kidney disease, or unspecified chronic kidney disease: Secondary | ICD-10-CM | POA: Diagnosis not present

## 2017-08-03 DIAGNOSIS — R809 Proteinuria, unspecified: Secondary | ICD-10-CM | POA: Diagnosis not present

## 2017-08-03 DIAGNOSIS — N183 Chronic kidney disease, stage 3 (moderate): Secondary | ICD-10-CM | POA: Diagnosis not present

## 2017-08-03 DIAGNOSIS — E1122 Type 2 diabetes mellitus with diabetic chronic kidney disease: Secondary | ICD-10-CM | POA: Diagnosis not present

## 2017-09-13 DIAGNOSIS — Z794 Long term (current) use of insulin: Secondary | ICD-10-CM | POA: Diagnosis not present

## 2017-09-13 DIAGNOSIS — Z79899 Other long term (current) drug therapy: Secondary | ICD-10-CM | POA: Diagnosis not present

## 2017-09-13 DIAGNOSIS — E119 Type 2 diabetes mellitus without complications: Secondary | ICD-10-CM | POA: Diagnosis not present

## 2017-09-13 DIAGNOSIS — E781 Pure hyperglyceridemia: Secondary | ICD-10-CM | POA: Diagnosis not present

## 2017-12-16 DIAGNOSIS — Z794 Long term (current) use of insulin: Secondary | ICD-10-CM | POA: Diagnosis not present

## 2017-12-16 DIAGNOSIS — E781 Pure hyperglyceridemia: Secondary | ICD-10-CM | POA: Diagnosis not present

## 2017-12-16 DIAGNOSIS — E119 Type 2 diabetes mellitus without complications: Secondary | ICD-10-CM | POA: Diagnosis not present

## 2018-01-11 ENCOUNTER — Other Ambulatory Visit: Payer: Self-pay | Admitting: Internal Medicine

## 2018-02-08 ENCOUNTER — Ambulatory Visit
Admission: EM | Admit: 2018-02-08 | Discharge: 2018-02-08 | Disposition: A | Discharge status: Home / Self Care | Payer: BLUE CROSS/BLUE SHIELD | Attending: Family Medicine | Admitting: Family Medicine

## 2018-02-08 ENCOUNTER — Encounter: Payer: Self-pay | Admitting: Emergency Medicine

## 2018-02-08 ENCOUNTER — Other Ambulatory Visit: Payer: Self-pay

## 2018-02-08 DIAGNOSIS — L989 Disorder of the skin and subcutaneous tissue, unspecified: Secondary | ICD-10-CM

## 2018-02-08 DIAGNOSIS — L089 Local infection of the skin and subcutaneous tissue, unspecified: Secondary | ICD-10-CM | POA: Diagnosis not present

## 2018-02-08 MED ORDER — MUPIROCIN 2 % EX OINT: 1.0000 "application " | TOPICAL_OINTMENT | Freq: Two times a day (BID) | CUTANEOUS | 0 refills | Status: AC

## 2018-02-08 NOTE — Discharge Instructions (Signed)
Medication as prescribed.  Warm compress.  Take care  Dr. Lacinda Axon

## 2018-02-08 NOTE — ED Provider Notes (Signed)
[facility]    CSN: [id number] Arrival date & time: 02/08/18  1533  History   Chief Complaint Chief Complaint  Patient presents with  . Sore   HPI  62 year old female presents with the above complaint.  Patient reports that for the past 3 weeks she has had a sore on the back of her right arm.  She states that it has drained and then developed purulence once again.  Mild redness.  No fever.  She has other small bumps underneath her chin which are scabbed over.  She also has a lesion in her scalp which is scabbed over and dry.  Patient states that her husband has drained the wound on her right arm and they have also applied topical antibiotic ointment.  No resolution.  No other associated symptoms.  No other complaints.  Past Medical History:  Diagnosis Date  . Cancer (HCC)    basil cell  . Diabetes mellitus   . Hyperlipidemia   . Hypertension     Patient Active Problem List   Diagnosis Date Noted  . Edema of both legs 01/02/2017  . Syncope 12/14/2016  . Vertigo 11/19/2016  . Daytime somnolence 11/19/2016  . Hearing loss 07/13/2015  . S/P bilateral cataract extraction 04/10/2015  . Colon polyp, hyperplastic 04/10/2015  . Proliferative diabetic retinopathy ([place]) 10/24/2014  . Dysplastic nevus 07/04/2014  . CKD stage 3 due to type 2 diabetes mellitus ([place]) 03/31/2014  . Perimenopause 10/14/2013  . Statin intolerance 10/14/2013  . Encounter for preventive health examination 12/29/2012  . Obesity (BMI 30-39.9) 03/26/2012  . DM (diabetes mellitus), type 2, uncontrolled, with renal complications ([place]) 89/21/1941  . Hypercholesterolemia with hypertriglyceridemia 03/14/2007  . Essential hypertension 03/14/2007    Past Surgical History:  Procedure Laterality Date  . CLOSED REDUCTION ANKLE DISLOCATION Left   . EYE SURGERY    . MOUTH SURGERY      OB History   None    Home Medications    Prior to Admission medications   Medication Sig Start Date End Date  Taking? Authorizing Provider  ACCU-CHEK COMPACT PLUS test strip Use 3 times a day 06/29/17  Yes Crecencio Mc, MD  ACCU-CHEK SOFTCLIX LANCETS lancets Use as instructed 11/18/16  Yes Crecencio Mc, MD  aspirin EC 81 MG tablet Take 1 tablet (81 mg total) by mouth daily. 02/27/17  Yes Crecencio Mc, MD  fenofibrate 160 MG tablet TAKE ONE TABLET BY MOUTH DAILY  06/23/17  Yes Crecencio Mc, MD  fish oil-omega-3 fatty acids 1000 MG capsule Take 1 g by mouth 2 (two) times daily.   Yes [provider]  HEALTHY ACCENTS UNIFINE PENTIP 31G X 8 MM MISC USE AS DIRECTED, ONE THREE TIMES DAILY  10/19/16  Yes Crecencio Mc, MD  HUMULIN R U-500 KWIKPEN 500 UNIT/ML kwikpen INJECT 75 UNITS AT BREAKFAST, 65 UNITS AT LUNCH AND 75 UNITS WITH SUPPER (MAX DAILY DOSE 215 UNITS) 04/20/17  Yes Crecencio Mc, MD  hydrochlorothiazide (HYDRODIURIL) 25 MG tablet Take 1 tablet (25 mg total) by mouth daily. 11/17/16  Yes Crecencio Mc, MD  Insulin Pen Needle (PEN NEEDLES) 31G X 6 MM MISC For use with victoza /saxenda 04/04/17  Yes Crecencio Mc, MD  liraglutide 18 MG/3ML SOPN 0.6 mg  SubQ  once daily for 1 week;  increase to 1.2 mg once daily;  may increase  to 1.8 mg once daily 04/04/17  Yes Crecencio Mc, MD  losartan (COZAAR) 100 MG  tablet TAKE ONE TABLET BY MOUTH DAILY  01/12/18  Yes Crecencio Mc, MD  Probiotic Product (PROBIOTIC MATURE ADULT) CAPS Take 1 capsule by mouth daily.   Yes [provider]  mupirocin ointment (BACTROBAN) 2 % Apply 1 application topically 2 (two) times daily for 7 days. 02/08/18 02/15/18  Coral Spikes, DO    Family History Family History  Problem Relation Age of Onset  . Diabetes Mother        deceased  . Heart disease Mother   . Stroke Mother   . Hypertension Mother   . Osteoporosis Mother   . Diabetes Father        deceased ,  car wreck at 56  . Coronary artery disease Father   . Heart disease Father   . Cancer Maternal Aunt        Ovarian  . Heart disease  Maternal Grandfather   . Heart disease Paternal Grandfather        MI  . Breast cancer Paternal Aunt    Social History Social History   Tobacco Use  . Smoking status: Never Smoker  . Smokeless tobacco: Never Used  Substance Use Topics  . Alcohol use: No  . Drug use: No   Allergies   Atorvastatin; Codeine sulfate; Metformin; and Zetia [ezetimibe]  Review of Systems Review of Systems  Constitutional: Negative.   Skin: Positive for wound.   Physical Exam Triage Vital Signs ED Triage Vitals  Enc Vitals Group     BP 02/08/18 1540 (!) 158/97     Pulse Rate 02/08/18 1540 (!) 108     Resp 02/08/18 1540 18     Temp 02/08/18 1540 99.2 F (37.3 C)     Temp Source 02/08/18 1540 Oral     SpO2 02/08/18 1540 98 %     Weight 02/08/18 1543 206 lb (93.4 kg)     Height 02/08/18 1543 5\' 7"  (1.702 m)     Head Circumference --      Peak Flow --      Pain Score 02/08/18 1543 0     Pain Loc --      Pain Edu? --      Excl. in [place]? --    Updated Vital Signs BP (!) 158/97 (BP Location: Left Arm)   Pulse (!) 108   Temp 99.2 F (37.3 C) (Oral)   Resp 18   Ht 5\' 7"  (1.702 m)   Wt 206 lb (93.4 kg)   LMP 01/29/2014 (Approximate)   SpO2 98%   BMI 32.26 kg/m   Physical Exam  Constitutional: She is oriented to person, place, and time. She appears well-developed. No distress.  HENT:  Head: Normocephalic and atraumatic.  Cardiovascular: Normal rate and regular rhythm.  Pulmonary/Chest: Effort normal and breath sounds normal. She has no wheezes. She has no rales.  Neurological: She is alert and oriented to person, place, and time.  Skin:  Right arm - tricep region: small pustule noted.   2 small areas with eschar noted underneath the chin.  Posterior scalp with a dry lesion with eschar. Mild erythema.  Psychiatric: She has a normal mood and affect. Her behavior is normal.  Nursing note and vitals reviewed.  UC Treatments / Results  Labs (all labs ordered are listed, but only  abnormal results are displayed) Labs Reviewed - No data to display  EKG None  Radiology No results found.  Procedures Procedures (including critical care time)  Medications Ordered in UC Medications -  No data to display  Initial Impression / Assessment and Plan / UC Course  I have reviewed the triage vital signs and the nursing notes.  Pertinent labs & imaging results that were available during my care of the patient were reviewed by me and considered in my medical decision making (see chart for details).    62 year old female presents with a pustule.  She also has a few other areas that are scabbed over.  Topical mupirocin.  Supportive care.  Final Clinical Impressions(s) / UC Diagnoses   Final diagnoses:  Pustule     Discharge Instructions     Medication as prescribed.  Warm compress.  Take care  Dr. Lacinda Axon    ED Prescriptions    Medication Sig Dispense Auth. Provider   mupirocin ointment (BACTROBAN) 2 % Apply 1 application topically 2 (two) times daily for 7 days. 22 g Coral Spikes, DO     Controlled Substance Prescriptions [place] Controlled Substance Registry consulted? Not Applicable   Coral Spikes, DO 02/08/18 1606

## 2018-02-08 NOTE — ED Triage Notes (Signed)
Patient c/o small red bump to back of left arm, under her chin and 2 in the back of her head. States she has had some  drainage from the sore on the back of her arm.

## 2018-03-20 DIAGNOSIS — Z794 Long term (current) use of insulin: Secondary | ICD-10-CM | POA: Diagnosis not present

## 2018-03-20 DIAGNOSIS — E119 Type 2 diabetes mellitus without complications: Secondary | ICD-10-CM | POA: Diagnosis not present

## 2018-03-20 DIAGNOSIS — E781 Pure hyperglyceridemia: Secondary | ICD-10-CM | POA: Diagnosis not present

## 2018-03-20 DIAGNOSIS — R5382 Chronic fatigue, unspecified: Secondary | ICD-10-CM | POA: Diagnosis not present

## 2018-03-20 DIAGNOSIS — I10 Essential (primary) hypertension: Secondary | ICD-10-CM | POA: Diagnosis not present

## 2018-03-20 DIAGNOSIS — R7989 Other specified abnormal findings of blood chemistry: Secondary | ICD-10-CM | POA: Diagnosis not present

## 2018-04-19 ENCOUNTER — Other Ambulatory Visit: Payer: Self-pay | Admitting: Internal Medicine

## 2018-06-01 DIAGNOSIS — E7800 Pure hypercholesterolemia, unspecified: Secondary | ICD-10-CM | POA: Diagnosis not present

## 2018-06-01 DIAGNOSIS — I10 Essential (primary) hypertension: Secondary | ICD-10-CM | POA: Diagnosis not present

## 2018-06-01 DIAGNOSIS — Z0001 Encounter for general adult medical examination with abnormal findings: Secondary | ICD-10-CM | POA: Diagnosis not present

## 2018-06-01 DIAGNOSIS — Z1331 Encounter for screening for depression: Secondary | ICD-10-CM | POA: Diagnosis not present

## 2018-06-01 DIAGNOSIS — N289 Disorder of kidney and ureter, unspecified: Secondary | ICD-10-CM | POA: Diagnosis not present

## 2018-06-01 DIAGNOSIS — E119 Type 2 diabetes mellitus without complications: Secondary | ICD-10-CM | POA: Diagnosis not present

## 2018-06-21 DIAGNOSIS — E781 Pure hyperglyceridemia: Secondary | ICD-10-CM | POA: Diagnosis not present

## 2018-06-21 DIAGNOSIS — E119 Type 2 diabetes mellitus without complications: Secondary | ICD-10-CM | POA: Diagnosis not present

## 2018-06-21 DIAGNOSIS — I10 Essential (primary) hypertension: Secondary | ICD-10-CM | POA: Diagnosis not present

## 2018-06-21 DIAGNOSIS — Z794 Long term (current) use of insulin: Secondary | ICD-10-CM | POA: Diagnosis not present

## 2018-07-03 DIAGNOSIS — I10 Essential (primary) hypertension: Secondary | ICD-10-CM | POA: Diagnosis not present

## 2018-07-03 DIAGNOSIS — Z6830 Body mass index (BMI) 30.0-30.9, adult: Secondary | ICD-10-CM | POA: Diagnosis not present

## 2018-07-03 DIAGNOSIS — Z789 Other specified health status: Secondary | ICD-10-CM | POA: Diagnosis not present

## 2018-07-03 DIAGNOSIS — E7800 Pure hypercholesterolemia, unspecified: Secondary | ICD-10-CM | POA: Diagnosis not present

## 2018-09-07 DIAGNOSIS — E119 Type 2 diabetes mellitus without complications: Secondary | ICD-10-CM | POA: Diagnosis not present

## 2018-09-07 DIAGNOSIS — Z6830 Body mass index (BMI) 30.0-30.9, adult: Secondary | ICD-10-CM | POA: Diagnosis not present

## 2018-09-07 DIAGNOSIS — E1169 Type 2 diabetes mellitus with other specified complication: Secondary | ICD-10-CM | POA: Diagnosis not present

## 2018-09-08 DIAGNOSIS — E1169 Type 2 diabetes mellitus with other specified complication: Secondary | ICD-10-CM | POA: Diagnosis not present

## 2018-12-22 DIAGNOSIS — I10 Essential (primary) hypertension: Secondary | ICD-10-CM | POA: Diagnosis not present

## 2018-12-22 DIAGNOSIS — E781 Pure hyperglyceridemia: Secondary | ICD-10-CM | POA: Diagnosis not present

## 2018-12-22 DIAGNOSIS — E119 Type 2 diabetes mellitus without complications: Secondary | ICD-10-CM | POA: Diagnosis not present

## 2018-12-22 DIAGNOSIS — Z794 Long term (current) use of insulin: Secondary | ICD-10-CM | POA: Diagnosis not present

## 2019-04-24 DIAGNOSIS — I10 Essential (primary) hypertension: Secondary | ICD-10-CM | POA: Diagnosis not present

## 2019-04-24 DIAGNOSIS — E119 Type 2 diabetes mellitus without complications: Secondary | ICD-10-CM | POA: Diagnosis not present

## 2019-04-24 DIAGNOSIS — Z794 Long term (current) use of insulin: Secondary | ICD-10-CM | POA: Diagnosis not present

## 2019-04-24 DIAGNOSIS — R5382 Chronic fatigue, unspecified: Secondary | ICD-10-CM | POA: Diagnosis not present

## 2019-04-24 DIAGNOSIS — E781 Pure hyperglyceridemia: Secondary | ICD-10-CM | POA: Diagnosis not present

## 2019-06-22 ENCOUNTER — Other Ambulatory Visit: Payer: Self-pay | Admitting: Internal Medicine

## 2019-07-27 DIAGNOSIS — H524 Presbyopia: Secondary | ICD-10-CM | POA: Diagnosis not present

## 2019-07-27 DIAGNOSIS — H31093 Other chorioretinal scars, bilateral: Secondary | ICD-10-CM | POA: Diagnosis not present

## 2019-08-01 DIAGNOSIS — E7801 Familial hypercholesterolemia: Secondary | ICD-10-CM | POA: Diagnosis not present

## 2019-08-01 DIAGNOSIS — E119 Type 2 diabetes mellitus without complications: Secondary | ICD-10-CM | POA: Diagnosis not present

## 2019-08-01 DIAGNOSIS — I10 Essential (primary) hypertension: Secondary | ICD-10-CM | POA: Diagnosis not present

## 2019-08-01 DIAGNOSIS — Z794 Long term (current) use of insulin: Secondary | ICD-10-CM | POA: Diagnosis not present

## 2019-10-19 ENCOUNTER — Other Ambulatory Visit: Payer: Self-pay | Admitting: Internal Medicine

## 2019-11-15 ENCOUNTER — Ambulatory Visit: Payer: BLUE CROSS/BLUE SHIELD | Attending: Internal Medicine

## 2019-11-15 DIAGNOSIS — Z23 Encounter for immunization: Secondary | ICD-10-CM

## 2019-11-15 NOTE — Progress Notes (Signed)
   Covid-19 Vaccination Clinic  Name:  Pamela Harris    MRN: [id number] DOB: May 03, 1956  11/15/2019  Ms. Sakal was observed post Covid-19 immunization for 15 minutes without incident. She was provided with Vaccine Information Sheet and instruction to access the V-Safe system.   Ms. Konja was instructed to call 911 with any severe reactions post vaccine: [name] Difficulty breathing  . Swelling of face and throat  . A fast heartbeat  . A bad rash all over body  . Dizziness and weakness   Immunizations Administered    Name Date Dose VIS Date Route   Pfizer COVID-19 Vaccine 11/15/2019  2:47 PM 0.3 mL 08/03/2019 Intramuscular   Manufacturer: [place]   Lot: [id number]   [place]: [id number]

## 2019-12-03 DIAGNOSIS — I10 Essential (primary) hypertension: Secondary | ICD-10-CM | POA: Diagnosis not present

## 2019-12-03 DIAGNOSIS — E781 Pure hyperglyceridemia: Secondary | ICD-10-CM | POA: Diagnosis not present

## 2019-12-03 DIAGNOSIS — E7801 Familial hypercholesterolemia: Secondary | ICD-10-CM | POA: Diagnosis not present

## 2019-12-03 DIAGNOSIS — Z794 Long term (current) use of insulin: Secondary | ICD-10-CM | POA: Diagnosis not present

## 2019-12-03 DIAGNOSIS — E119 Type 2 diabetes mellitus without complications: Secondary | ICD-10-CM | POA: Diagnosis not present

## 2019-12-10 ENCOUNTER — Ambulatory Visit: Payer: BLUE CROSS/BLUE SHIELD | Attending: Internal Medicine

## 2019-12-10 DIAGNOSIS — Z23 Encounter for immunization: Secondary | ICD-10-CM

## 2019-12-10 NOTE — Progress Notes (Signed)
   Covid-19 Vaccination Clinic  Name:  Pamela Harris    MRN: [id number] DOB: 05-26-1956  12/10/2019  Pamela Harris was observed post Covid-19 immunization for 15 minutes without incident. She was provided with Vaccine Information Sheet and instruction to access the V-Safe system.   Pamela Harris was instructed to call 911 with any severe reactions post vaccine: [name] Difficulty breathing  . Swelling of face and throat  . A fast heartbeat  . A bad rash all over body  . Dizziness and weakness   Immunizations Administered    Name Date Dose VIS Date Route   Pfizer COVID-19 Vaccine 12/10/2019 12:07 PM 0.3 mL 10/17/2018 Intramuscular   Manufacturer: [place]   Lot: [id number]   [place]: [id number]

## 2019-12-24 ENCOUNTER — Other Ambulatory Visit: Payer: Self-pay | Admitting: Internal Medicine

## 2020-03-20 DIAGNOSIS — E119 Type 2 diabetes mellitus without complications: Secondary | ICD-10-CM | POA: Diagnosis not present

## 2020-03-20 DIAGNOSIS — Z794 Long term (current) use of insulin: Secondary | ICD-10-CM | POA: Diagnosis not present

## 2020-03-20 DIAGNOSIS — E7801 Familial hypercholesterolemia: Secondary | ICD-10-CM | POA: Diagnosis not present

## 2020-03-20 DIAGNOSIS — I10 Essential (primary) hypertension: Secondary | ICD-10-CM | POA: Diagnosis not present

## 2020-07-15 ENCOUNTER — Other Ambulatory Visit: Payer: Self-pay | Admitting: Internal Medicine

## 2020-07-15 ENCOUNTER — Ambulatory Visit: Payer: BLUE CROSS/BLUE SHIELD | Attending: Internal Medicine

## 2020-07-15 DIAGNOSIS — Z23 Encounter for immunization: Secondary | ICD-10-CM

## 2020-07-15 NOTE — Progress Notes (Signed)
   Covid-19 Vaccination Clinic  Name:  BREXLEE HEBERLEIN    MRN: [id number] DOB: 07-19-1956  07/15/2020  Ms. Lakatos was observed post Covid-19 immunization for 15 minutes without incident. She was provided with Vaccine Information Sheet and instruction to access the V-Safe system.   Ms. Presas was instructed to call 911 with any severe reactions post vaccine: [name] Difficulty breathing  . Swelling of face and throat  . A fast heartbeat  . A bad rash all over body  . Dizziness and weakness   Immunizations Administered    Name Date Dose VIS Date Route   Pfizer COVID-19 Vaccine 07/15/2020  9:13 AM 0.3 mL 06/11/2020 Intramuscular   Manufacturer: [place]   Lot: [id number]   [place]: [id number]

## 2020-07-22 DIAGNOSIS — E7801 Familial hypercholesterolemia: Secondary | ICD-10-CM | POA: Diagnosis not present

## 2020-07-22 DIAGNOSIS — E119 Type 2 diabetes mellitus without complications: Secondary | ICD-10-CM | POA: Diagnosis not present

## 2020-07-22 DIAGNOSIS — I10 Essential (primary) hypertension: Secondary | ICD-10-CM | POA: Diagnosis not present

## 2020-07-22 DIAGNOSIS — Z794 Long term (current) use of insulin: Secondary | ICD-10-CM | POA: Diagnosis not present

## 2020-08-05 DIAGNOSIS — H52223 Regular astigmatism, bilateral: Secondary | ICD-10-CM | POA: Diagnosis not present

## 2020-09-17 ENCOUNTER — Encounter: Payer: Self-pay | Admitting: *Deleted

## 2020-11-25 ENCOUNTER — Other Ambulatory Visit: Payer: Self-pay | Admitting: Internal Medicine

## 2021-01-21 ENCOUNTER — Ambulatory Visit
Admission: EM | Admit: 2021-01-21 | Discharge: 2021-01-21 | Disposition: A | Discharge status: Home / Self Care | Payer: BC Managed Care – PPO | Attending: Physician Assistant | Admitting: Physician Assistant

## 2021-01-21 ENCOUNTER — Other Ambulatory Visit: Payer: Self-pay

## 2021-01-21 DIAGNOSIS — L03312 Cellulitis of back [any part except buttock]: Secondary | ICD-10-CM

## 2021-01-21 MED ORDER — DOXYCYCLINE HYCLATE 100 MG PO CAPS: 100.0000 mg | ORAL_CAPSULE | Freq: Two times a day (BID) | ORAL | 0 refills | Status: AC

## 2021-01-21 MED ORDER — CEPHALEXIN 500 MG PO CAPS: 500.0000 mg | ORAL_CAPSULE | Freq: Four times a day (QID) | ORAL | 0 refills | Status: AC

## 2021-01-21 NOTE — ED Provider Notes (Signed)
[facility]    CSN: [id number] Arrival date & time: 01/21/21  1027      History   Chief Complaint Chief Complaint  Patient presents with  . Skin Problem    HPI Pamela Harris is a 65 y.o. female presenting for approximately 3-day history of an area of redness, swelling and pain of the left lower back.  Patient says it has gotten a little bigger since Monday.  She says it is warm to touch.  Patient states that she has had an area of swelling without pain there for some time.  She denies any associated fevers or fatigue.  Patient has not taken any OTC meds for pain or discomfort.  She has not applied warm compresses to the area.  She is denying any history of recurrent skin infections or MRSA.  No other concerns.  HPI  Past Medical History:  Diagnosis Date  . Cancer (HCC)    basil cell  . Diabetes mellitus   . Hyperlipidemia   . Hypertension     Patient Active Problem List   Diagnosis Date Noted  . Edema of both legs 01/02/2017  . Syncope 12/14/2016  . Vertigo 11/19/2016  . Daytime somnolence 11/19/2016  . Hearing loss 07/13/2015  . S/P bilateral cataract extraction 04/10/2015  . Colon polyp, hyperplastic 04/10/2015  . Proliferative diabetic retinopathy ([place]) 10/24/2014  . Dysplastic nevus 07/04/2014  . CKD stage 3 due to type 2 diabetes mellitus ([place]) 03/31/2014  . Perimenopause 10/14/2013  . Statin intolerance 10/14/2013  . Encounter for preventive health examination 12/29/2012  . Obesity (BMI 30-39.9) 03/26/2012  . DM (diabetes mellitus), type 2, uncontrolled, with renal complications ([place]) 67/89/3810  . Hypercholesterolemia with hypertriglyceridemia 03/14/2007  . Essential hypertension 03/14/2007    Past Surgical History:  Procedure Laterality Date  . CLOSED REDUCTION ANKLE DISLOCATION Left   . EYE SURGERY    . MOUTH SURGERY      OB History   No obstetric history on file.      Home Medications    Prior to Admission medications    Medication Sig Start Date End Date Taking? Authorizing Provider  ACCU-CHEK COMPACT PLUS test strip Use 3 times a day 06/29/17  Yes Crecencio Mc, MD  ACCU-CHEK SOFTCLIX LANCETS lancets Use as instructed 11/18/16  Yes Crecencio Mc, MD  aspirin EC 81 MG tablet Take 1 tablet (81 mg total) by mouth daily. 02/27/17  Yes Crecencio Mc, MD  cephALEXin (KEFLEX) 500 MG capsule Take 1 capsule (500 mg total) by mouth 4 (four) times daily for 7 days. 01/21/21 01/28/21 Yes Danton Clap, PA-C  doxycycline (VIBRAMYCIN) 100 MG capsule Take 1 capsule (100 mg total) by mouth 2 (two) times daily for 7 days. 01/21/21 01/28/21 Yes Laurene Footman B, PA-C  fenofibrate 160 MG tablet TAKE ONE TABLET BY MOUTH DAILY  06/23/17  Yes Crecencio Mc, MD  HUMULIN R U-500 KWIKPEN 500 UNIT/ML kwikpen INJECT 75 UNITS AT BREAKFAST, 65 UNITS AT LUNCH AND 75 UNITS WITH SUPPER (MAX DAILY DOSE 215 UNITS) 04/20/17  Yes Crecencio Mc, MD  hydrochlorothiazide (HYDRODIURIL) 25 MG tablet Take 1 tablet (25 mg total) by mouth daily. 11/17/16  Yes Crecencio Mc, MD  Insulin Pen Needle (PEN NEEDLES) 31G X 6 MM MISC For use with victoza /saxenda 04/04/17  Yes Crecencio Mc, MD  liraglutide 18 MG/3ML SOPN 0.6 mg  SubQ  once daily for 1 week;  increase to 1.2 mg once daily;  may increase  to 1.8 mg once daily 04/04/17  Yes Crecencio Mc, MD  losartan (COZAAR) 100 MG tablet TAKE ONE TABLET BY MOUTH DAILY  01/12/18  Yes Crecencio Mc, MD  Probiotic Product (PROBIOTIC MATURE ADULT) CAPS Take 1 capsule by mouth daily.   Yes [provider]  UNIFINE PENTIPS 31G X 8 MM MISC Use as directed, use 3 times daily 11/25/20  Yes Crecencio Mc, MD  COVID-19 mRNA vaccine, Pfizer, 30 MCG/0.3ML injection USE AS DIRECTED 07/15/20 07/15/21  Carlyle Basques, MD  fish oil-omega-3 fatty acids 1000 MG capsule Take 1 g by mouth 2 (two) times daily.    [provider]    Family History Family History  Problem Relation Age of Onset  . Diabetes Mother         deceased  . Heart disease Mother   . Stroke Mother   . Hypertension Mother   . Osteoporosis Mother   . Diabetes Father        deceased ,  car wreck at 69  . Coronary artery disease Father   . Heart disease Father   . Cancer Maternal Aunt        Ovarian  . Heart disease Maternal Grandfather   . Heart disease Paternal Grandfather        MI  . Breast cancer Paternal Aunt     Social History Social History   Tobacco Use  . Smoking status: Never Smoker  . Smokeless tobacco: Never Used  Vaping Use  . Vaping Use: Never used  Substance Use Topics  . Alcohol use: No  . Drug use: No     Allergies   Atorvastatin, Codeine sulfate, Metformin, and Zetia [ezetimibe]   Review of Systems Review of Systems  Constitutional: Negative for fatigue and fever.  Musculoskeletal: Positive for back pain.  Skin: Positive for color change. Negative for rash and wound.  Neurological: Negative for weakness and numbness.     Physical Exam Triage Vital Signs ED Triage Vitals  Enc Vitals Group     BP 01/21/21 1058 (!) 163/83     Pulse Rate 01/21/21 1058 94     Resp 01/21/21 1058 18     Temp 01/21/21 1058 98.5 F (36.9 C)     Temp Source 01/21/21 1058 Oral     SpO2 01/21/21 1058 100 %     Weight 01/21/21 1056 211 lb 3.2 oz (95.8 kg)     Height --      Head Circumference --      Peak Flow --      Pain Score 01/21/21 1055 3     Pain Loc --      Pain Edu? --      Excl. in [place]? --    No data found.  Updated Vital Signs BP (!) 163/83 (BP Location: Right Arm)   Pulse 94   Temp 98.5 F (36.9 C) (Oral)   Resp 18   Wt 211 lb 3.2 oz (95.8 kg)   LMP 01/29/2014 (Approximate)   SpO2 100%   BMI 33.08 kg/m       Physical Exam Vitals and nursing note reviewed.  Constitutional:      General: She is not in acute distress.    Appearance: Normal appearance. She is not ill-appearing or toxic-appearing.  HENT:     Head: Normocephalic and atraumatic.  Eyes:     General: No scleral  icterus.       Right eye: No discharge.  Left eye: No discharge.     Conjunctiva/sclera: Conjunctivae normal.  Cardiovascular:     Rate and Rhythm: Normal rate and regular rhythm.     Heart sounds: Normal heart sounds.  Pulmonary:     Effort: Pulmonary effort is normal. No respiratory distress.     Breath sounds: Normal breath sounds.  Musculoskeletal:     Cervical back: Neck supple.  Skin:    General: Skin is dry.          Comments: There is a large area of erythema, induration, warmth and TTP of left lower back. No fluctuance  Neurological:     General: No focal deficit present.     Mental Status: She is alert. Mental status is at baseline.     Motor: No weakness.     Gait: Gait normal.  Psychiatric:        Mood and Affect: Mood normal.        Behavior: Behavior normal.        Thought Content: Thought content normal.      UC Treatments / Results  Labs (all labs ordered are listed, but only abnormal results are displayed) Labs Reviewed - No data to display  EKG   Radiology No results found.  Procedures Procedures (including critical care time)  Medications Ordered in UC Medications - No data to display  Initial Impression / Assessment and Plan / UC Course  I have reviewed the triage vital signs and the nursing notes.  Pertinent labs & imaging results that were available during my care of the patient were reviewed by me and considered in my medical decision making (see chart for details).   65 year old female presenting for an area of redness and swelling along with pain over the past 3 days.  Clinical presentation consistent with cellulitis/early abscess.  Treating patient at this time with Keflex and doxycycline to cover multiple possible bacterial causes.  She is a diabetic.  Also by supportive care with using warm compresses and taking Tylenol as needed for discomfort and pain.  Advised her to try monitor area closely and the redness should be  shrinking.  Advised that if it is not or she starts to feel any systemic symptoms or have a fever then she should be seen again immediately.  Advised to follow-up with dermatology once this is cleared up if there is any remaining swelling as she may have an underlying sebaceous cyst that needs to be removed.  Final Clinical Impressions(s) / UC Diagnoses   Final diagnoses:  Cellulitis of back     Discharge Instructions     You likely have an infected sebaceous cyst.  I have sent in antibiotics for you.  Apply warm compresses to the area every couple of hours.  This should help decrease the redness and swelling and may cause the area to open up and drain some pustular fluid.  That is okay as long as the redness is drinking and you are feeling better.  If you start to get a fever or the area of redness is spreading after the next 2 to 3 days you should be seen again.  May consider seeing dermatology at some point if you still have remaining swelling in the area.  They may need to remove the cyst.    ED Prescriptions    Medication Sig Dispense Auth. Provider   cephALEXin (KEFLEX) 500 MG capsule Take 1 capsule (500 mg total) by mouth 4 (four) times daily for 7 days. 28 capsule  Danton Clap, PA-C   doxycycline (VIBRAMYCIN) 100 MG capsule Take 1 capsule (100 mg total) by mouth 2 (two) times daily for 7 days. 14 capsule Danton Clap, PA-C     PDMP not reviewed this encounter.   Danton Clap, PA-C 01/21/21 1127

## 2021-01-21 NOTE — ED Triage Notes (Signed)
Patient states that she noticed a small area on left lower back on Monday and states that it has been growing since. Patient with deep erythema around area.

## 2021-01-21 NOTE — Discharge Instructions (Signed)
You likely have an infected sebaceous cyst.  I have sent in antibiotics for you.  Apply warm compresses to the area every couple of hours.  This should help decrease the redness and swelling and may cause the area to open up and drain some pustular fluid.  That is okay as long as the redness is drinking and you are feeling better.  If you start to get a fever or the area of redness is spreading after the next 2 to 3 days you should be seen again.  May consider seeing dermatology at some point if you still have remaining swelling in the area.  They may need to remove the cyst.

## 2021-03-17 ENCOUNTER — Other Ambulatory Visit: Payer: Self-pay

## 2021-03-17 ENCOUNTER — Ambulatory Visit: Payer: BC Managed Care – PPO | Attending: Internal Medicine

## 2021-03-17 DIAGNOSIS — Z23 Encounter for immunization: Secondary | ICD-10-CM

## 2021-03-17 MED ORDER — PFIZER-BIONT COVID-19 VAC-TRIS 30 MCG/0.3ML IM SUSP
INTRAMUSCULAR | 0 refills | Status: AC
  Filled 2021-03-17: qty 0.3, 15d supply, fill #0

## 2021-03-17 NOTE — Progress Notes (Signed)
   Covid-19 Vaccination Clinic  Name:  Pamela Harris    MRN: [id number] DOB: 04-20-1956  03/17/2021  [name] was observed post Covid-19 immunization for 15 minutes without incident. She was provided with Vaccine Information Sheet and instruction to access the V-Safe system.   [name] was instructed to call 911 with any severe reactions post vaccine: Difficulty breathing  Swelling of face and throat  A fast heartbeat  A bad rash all over body  Dizziness and weakness   Immunizations Administered     Name Date Dose VIS Date Route   PFIZER Comrnaty([place] TOP) Covid-19 Vaccine 03/17/2021  1:13 PM 0.3 mL 07/31/2020 Intramuscular   Manufacturer: [place]   Lot: [id number]   [place]: [id number]      Lu Duffel, PharmD, MBA Clinical Acute Care Pharmacist]

## 2021-03-20 DIAGNOSIS — E7801 Familial hypercholesterolemia: Secondary | ICD-10-CM | POA: Diagnosis not present

## 2021-03-20 DIAGNOSIS — Z794 Long term (current) use of insulin: Secondary | ICD-10-CM | POA: Diagnosis not present

## 2021-03-20 DIAGNOSIS — I10 Essential (primary) hypertension: Secondary | ICD-10-CM | POA: Diagnosis not present

## 2021-03-20 DIAGNOSIS — M791 Myalgia, unspecified site: Secondary | ICD-10-CM | POA: Diagnosis not present

## 2021-03-20 DIAGNOSIS — E119 Type 2 diabetes mellitus without complications: Secondary | ICD-10-CM | POA: Diagnosis not present

## 2021-06-12 ENCOUNTER — Ambulatory Visit: Payer: BC Managed Care – PPO | Attending: Internal Medicine

## 2021-06-12 ENCOUNTER — Other Ambulatory Visit: Payer: Self-pay

## 2021-06-12 DIAGNOSIS — Z23 Encounter for immunization: Secondary | ICD-10-CM

## 2021-06-12 MED ORDER — PFIZER COVID-19 VAC BIVALENT 30 MCG/0.3ML IM SUSP
INTRAMUSCULAR | 0 refills | Status: AC
  Filled 2021-06-12: qty 0.3, 1d supply, fill #0

## 2021-06-12 NOTE — Progress Notes (Signed)
   Covid-19 Vaccination Clinic  Name:  Pamela Harris    MRN: [id number] DOB: Apr 30, 1956  06/12/2021  [name] was observed post Covid-19 immunization for 15 minutes without incident. She was provided with Vaccine Information Sheet and instruction to access the V-Safe system.   [name] was instructed to call 911 with any severe reactions post vaccine: Difficulty breathing  Swelling of face and throat  A fast heartbeat  A bad rash all over body  Dizziness and weakness   Immunizations Administered     Name Date Dose VIS Date Route   Pfizer Covid-19 Vaccine Bivalent Booster 06/12/2021 11:56 AM 0.3 mL 04/22/2021 Intramuscular   Manufacturer: [place]   Lot: [id number]: [place], PharmD, MBA Clinical Acute Care Pharmacist

## 2021-07-21 DIAGNOSIS — Z7984 Long term (current) use of oral hypoglycemic drugs: Secondary | ICD-10-CM | POA: Diagnosis not present

## 2021-07-21 DIAGNOSIS — I10 Essential (primary) hypertension: Secondary | ICD-10-CM | POA: Diagnosis not present

## 2021-07-21 DIAGNOSIS — Z794 Long term (current) use of insulin: Secondary | ICD-10-CM | POA: Diagnosis not present

## 2021-07-21 DIAGNOSIS — E7801 Familial hypercholesterolemia: Secondary | ICD-10-CM | POA: Diagnosis not present

## 2021-07-21 DIAGNOSIS — E119 Type 2 diabetes mellitus without complications: Secondary | ICD-10-CM | POA: Diagnosis not present

## 2021-10-21 DIAGNOSIS — Z7984 Long term (current) use of oral hypoglycemic drugs: Secondary | ICD-10-CM | POA: Diagnosis not present

## 2021-10-21 DIAGNOSIS — H26492 Other secondary cataract, left eye: Secondary | ICD-10-CM | POA: Diagnosis not present

## 2021-10-21 DIAGNOSIS — H35341 Macular cyst, hole, or pseudohole, right eye: Secondary | ICD-10-CM | POA: Diagnosis not present

## 2021-10-21 DIAGNOSIS — E119 Type 2 diabetes mellitus without complications: Secondary | ICD-10-CM | POA: Diagnosis not present

## 2021-11-09 DIAGNOSIS — H35341 Macular cyst, hole, or pseudohole, right eye: Secondary | ICD-10-CM | POA: Diagnosis not present

## 2021-11-09 DIAGNOSIS — Z961 Presence of intraocular lens: Secondary | ICD-10-CM | POA: Diagnosis not present

## 2021-11-09 DIAGNOSIS — H02833 Dermatochalasis of right eye, unspecified eyelid: Secondary | ICD-10-CM | POA: Diagnosis not present

## 2021-11-09 DIAGNOSIS — H26492 Other secondary cataract, left eye: Secondary | ICD-10-CM | POA: Diagnosis not present

## 2021-11-17 DIAGNOSIS — Z7984 Long term (current) use of oral hypoglycemic drugs: Secondary | ICD-10-CM | POA: Diagnosis not present

## 2021-11-17 DIAGNOSIS — H26492 Other secondary cataract, left eye: Secondary | ICD-10-CM | POA: Diagnosis not present

## 2021-11-17 DIAGNOSIS — Z9889 Other specified postprocedural states: Secondary | ICD-10-CM | POA: Diagnosis not present

## 2021-11-17 DIAGNOSIS — E119 Type 2 diabetes mellitus without complications: Secondary | ICD-10-CM | POA: Diagnosis not present

## 2021-11-18 DIAGNOSIS — E119 Type 2 diabetes mellitus without complications: Secondary | ICD-10-CM | POA: Diagnosis not present

## 2021-11-18 DIAGNOSIS — E7801 Familial hypercholesterolemia: Secondary | ICD-10-CM | POA: Diagnosis not present

## 2021-11-18 DIAGNOSIS — Z794 Long term (current) use of insulin: Secondary | ICD-10-CM | POA: Diagnosis not present

## 2021-11-18 DIAGNOSIS — Z7984 Long term (current) use of oral hypoglycemic drugs: Secondary | ICD-10-CM | POA: Diagnosis not present

## 2021-11-18 DIAGNOSIS — I10 Essential (primary) hypertension: Secondary | ICD-10-CM | POA: Diagnosis not present

## 2022-02-18 DIAGNOSIS — Z7984 Long term (current) use of oral hypoglycemic drugs: Secondary | ICD-10-CM | POA: Diagnosis not present

## 2022-02-18 DIAGNOSIS — Z794 Long term (current) use of insulin: Secondary | ICD-10-CM | POA: Diagnosis not present

## 2022-02-18 DIAGNOSIS — E7801 Familial hypercholesterolemia: Secondary | ICD-10-CM | POA: Diagnosis not present

## 2022-02-18 DIAGNOSIS — E119 Type 2 diabetes mellitus without complications: Secondary | ICD-10-CM | POA: Diagnosis not present

## 2022-02-18 DIAGNOSIS — I10 Essential (primary) hypertension: Secondary | ICD-10-CM | POA: Diagnosis not present

## 2022-03-23 ENCOUNTER — Other Ambulatory Visit (HOSPITAL_COMMUNITY): Payer: Self-pay

## 2022-05-18 DIAGNOSIS — E119 Type 2 diabetes mellitus without complications: Secondary | ICD-10-CM | POA: Diagnosis not present

## 2022-05-18 DIAGNOSIS — H35341 Macular cyst, hole, or pseudohole, right eye: Secondary | ICD-10-CM | POA: Diagnosis not present

## 2022-05-18 DIAGNOSIS — Z961 Presence of intraocular lens: Secondary | ICD-10-CM | POA: Diagnosis not present

## 2022-05-18 DIAGNOSIS — Z7984 Long term (current) use of oral hypoglycemic drugs: Secondary | ICD-10-CM | POA: Diagnosis not present

## 2022-05-24 DIAGNOSIS — Z794 Long term (current) use of insulin: Secondary | ICD-10-CM | POA: Diagnosis not present

## 2022-05-24 DIAGNOSIS — E7801 Familial hypercholesterolemia: Secondary | ICD-10-CM | POA: Diagnosis not present

## 2022-05-24 DIAGNOSIS — E119 Type 2 diabetes mellitus without complications: Secondary | ICD-10-CM | POA: Diagnosis not present

## 2022-05-24 DIAGNOSIS — I10 Essential (primary) hypertension: Secondary | ICD-10-CM | POA: Diagnosis not present

## 2022-05-24 DIAGNOSIS — Z7984 Long term (current) use of oral hypoglycemic drugs: Secondary | ICD-10-CM | POA: Diagnosis not present

## 2022-06-03 ENCOUNTER — Other Ambulatory Visit: Payer: Self-pay

## 2022-06-03 MED ORDER — COMIRNATY 30 MCG/0.3ML IM SUSP
INTRAMUSCULAR | 0 refills | Status: AC
  Filled 2022-06-09: qty 0.3, 1d supply, fill #0

## 2022-06-09 ENCOUNTER — Other Ambulatory Visit: Payer: Self-pay

## 2022-07-06 DIAGNOSIS — E119 Type 2 diabetes mellitus without complications: Secondary | ICD-10-CM | POA: Diagnosis not present

## 2022-07-06 DIAGNOSIS — Z794 Long term (current) use of insulin: Secondary | ICD-10-CM | POA: Diagnosis not present

## 2022-09-14 DIAGNOSIS — E119 Type 2 diabetes mellitus without complications: Secondary | ICD-10-CM | POA: Diagnosis not present

## 2022-09-14 DIAGNOSIS — Z794 Long term (current) use of insulin: Secondary | ICD-10-CM | POA: Diagnosis not present

## 2022-09-14 DIAGNOSIS — I10 Essential (primary) hypertension: Secondary | ICD-10-CM | POA: Diagnosis not present

## 2022-09-14 DIAGNOSIS — E7801 Familial hypercholesterolemia: Secondary | ICD-10-CM | POA: Diagnosis not present

## 2022-09-14 DIAGNOSIS — Z7984 Long term (current) use of oral hypoglycemic drugs: Secondary | ICD-10-CM | POA: Diagnosis not present

## 2022-11-16 DIAGNOSIS — Z7984 Long term (current) use of oral hypoglycemic drugs: Secondary | ICD-10-CM | POA: Diagnosis not present

## 2022-11-16 DIAGNOSIS — E119 Type 2 diabetes mellitus without complications: Secondary | ICD-10-CM | POA: Diagnosis not present

## 2022-11-16 DIAGNOSIS — H43811 Vitreous degeneration, right eye: Secondary | ICD-10-CM | POA: Diagnosis not present

## 2022-11-16 DIAGNOSIS — H35341 Macular cyst, hole, or pseudohole, right eye: Secondary | ICD-10-CM | POA: Diagnosis not present

## 2023-05-05 DIAGNOSIS — I10 Essential (primary) hypertension: Secondary | ICD-10-CM | POA: Diagnosis not present

## 2023-05-05 DIAGNOSIS — E7801 Familial hypercholesterolemia: Secondary | ICD-10-CM | POA: Diagnosis not present

## 2023-05-05 DIAGNOSIS — Z794 Long term (current) use of insulin: Secondary | ICD-10-CM | POA: Diagnosis not present

## 2023-05-05 DIAGNOSIS — E119 Type 2 diabetes mellitus without complications: Secondary | ICD-10-CM | POA: Diagnosis not present

## 2023-10-24 ENCOUNTER — Ambulatory Visit: Payer: Medicare Other | Admitting: Family Medicine

## 2023-11-17 ENCOUNTER — Encounter: Payer: Self-pay | Admitting: Internal Medicine

## 2023-11-17 DIAGNOSIS — Z1231 Encounter for screening mammogram for malignant neoplasm of breast: Secondary | ICD-10-CM

## 2024-10-23 ENCOUNTER — Ambulatory Visit: Admitting: Internal Medicine
